# Patient Record
Sex: Female | Born: 1960 | Race: White | Hispanic: No | Marital: Single | Smoking: Never smoker
Health system: Southern US, Community
[De-identification: ages and names within clinical notes are randomized; demographics above are authoritative.]

## PROBLEM LIST (undated history)

## (undated) DIAGNOSIS — R634 Abnormal weight loss: Secondary | ICD-10-CM

## (undated) DIAGNOSIS — M109 Gout, unspecified: Secondary | ICD-10-CM

## (undated) DIAGNOSIS — K219 Gastro-esophageal reflux disease without esophagitis: Secondary | ICD-10-CM

## (undated) DIAGNOSIS — G5612 Other lesions of median nerve, left upper limb: Secondary | ICD-10-CM

## (undated) DIAGNOSIS — Z9884 Bariatric surgery status: Secondary | ICD-10-CM

## (undated) DIAGNOSIS — E7800 Pure hypercholesterolemia, unspecified: Secondary | ICD-10-CM

## (undated) DIAGNOSIS — G5620 Lesion of ulnar nerve, unspecified upper limb: Secondary | ICD-10-CM

## (undated) DIAGNOSIS — F419 Anxiety disorder, unspecified: Secondary | ICD-10-CM

## (undated) DIAGNOSIS — D649 Anemia, unspecified: Secondary | ICD-10-CM

## (undated) DIAGNOSIS — S2232XA Fracture of one rib, left side, initial encounter for closed fracture: Secondary | ICD-10-CM

## (undated) DIAGNOSIS — S63012A Subluxation of distal radioulnar joint of left wrist, initial encounter: Secondary | ICD-10-CM

## (undated) DIAGNOSIS — S52372A Galeazzi's fracture of left radius, initial encounter for closed fracture: Secondary | ICD-10-CM

## (undated) DIAGNOSIS — I10 Essential (primary) hypertension: Secondary | ICD-10-CM

## (undated) HISTORY — PX: TONSILLECTOMY: SUR1361

## (undated) HISTORY — DX: Abnormal weight loss: R63.4

## (undated) HISTORY — DX: Essential (primary) hypertension: I10

## (undated) HISTORY — PX: LAPAROSCOPIC GASTRIC BANDING: SHX1100

## (undated) HISTORY — PX: AUGMENTATION MAMMAPLASTY: SUR837

---

## 2000-04-28 ENCOUNTER — Encounter: Admission: RE | Admit: 2000-04-28 | Discharge: 2000-07-27 | Payer: Self-pay | Admitting: Internal Medicine

## 2001-01-12 ENCOUNTER — Other Ambulatory Visit: Admission: RE | Admit: 2001-01-12 | Discharge: 2001-01-12 | Payer: Self-pay | Admitting: *Deleted

## 2005-07-14 ENCOUNTER — Encounter: Admission: RE | Admit: 2005-07-14 | Discharge: 2005-10-12 | Payer: Self-pay | Admitting: Chiropractic Medicine

## 2007-09-14 ENCOUNTER — Encounter: Admission: RE | Admit: 2007-09-14 | Discharge: 2007-09-14 | Payer: Self-pay | Admitting: General Surgery

## 2007-09-19 ENCOUNTER — Ambulatory Visit (HOSPITAL_COMMUNITY): Admission: RE | Admit: 2007-09-19 | Discharge: 2007-09-19 | Payer: Self-pay | Admitting: General Surgery

## 2008-02-16 ENCOUNTER — Encounter: Admission: RE | Admit: 2008-02-16 | Discharge: 2008-03-19 | Payer: Self-pay | Admitting: General Surgery

## 2008-03-06 ENCOUNTER — Ambulatory Visit (HOSPITAL_COMMUNITY): Admission: RE | Admit: 2008-03-06 | Discharge: 2008-03-07 | Payer: Self-pay | Admitting: General Surgery

## 2008-03-09 HISTORY — PX: LAPAROSCOPIC GASTRIC BANDING: SHX1100

## 2008-03-20 ENCOUNTER — Encounter: Admission: RE | Admit: 2008-03-20 | Discharge: 2008-06-18 | Payer: Self-pay | Admitting: General Surgery

## 2008-06-14 ENCOUNTER — Encounter: Admission: RE | Admit: 2008-06-14 | Discharge: 2008-06-14 | Payer: Self-pay | Admitting: General Surgery

## 2008-09-13 ENCOUNTER — Encounter: Admission: RE | Admit: 2008-09-13 | Discharge: 2008-09-13 | Payer: Self-pay | Admitting: General Surgery

## 2009-07-23 ENCOUNTER — Encounter: Admission: RE | Admit: 2009-07-23 | Discharge: 2009-07-23 | Payer: Self-pay | Admitting: Family Medicine

## 2009-07-30 ENCOUNTER — Ambulatory Visit (HOSPITAL_BASED_OUTPATIENT_CLINIC_OR_DEPARTMENT_OTHER): Admission: RE | Admit: 2009-07-30 | Discharge: 2009-07-30 | Payer: Self-pay | Admitting: Urology

## 2010-01-27 HISTORY — PX: AUGMENTATION MAMMAPLASTY: SUR837

## 2010-07-10 ENCOUNTER — Encounter: Payer: Self-pay | Admitting: Oncology

## 2010-08-08 ENCOUNTER — Encounter (HOSPITAL_BASED_OUTPATIENT_CLINIC_OR_DEPARTMENT_OTHER): Payer: BC Managed Care – PPO | Admitting: Oncology

## 2010-08-08 ENCOUNTER — Other Ambulatory Visit: Payer: Self-pay | Admitting: Oncology

## 2010-08-08 DIAGNOSIS — D649 Anemia, unspecified: Secondary | ICD-10-CM

## 2010-08-08 LAB — FERRITIN: Ferritin: 8 ng/mL — ABNORMAL LOW (ref 10–291)

## 2010-08-08 LAB — CBC WITH DIFFERENTIAL/PLATELET
BASO%: 0.5 % (ref 0.0–2.0)
EOS%: 0.8 % (ref 0.0–7.0)
Eosinophils Absolute: 0.1 10*3/uL (ref 0.0–0.5)
HCT: 33.6 % — ABNORMAL LOW (ref 34.8–46.6)
MONO%: 9.3 % (ref 0.0–14.0)
NEUT%: 71.1 % (ref 38.4–76.8)
Platelets: 253 10*3/uL (ref 145–400)

## 2010-08-11 LAB — POCT HEMOGLOBIN-HEMACUE: Hemoglobin: 10.5 g/dL — ABNORMAL LOW (ref 12.0–15.0)

## 2010-08-14 ENCOUNTER — Other Ambulatory Visit: Payer: Self-pay | Admitting: Oncology

## 2010-08-14 LAB — FECAL OCCULT BLOOD, GUAIAC: Occult Blood: NEGATIVE

## 2010-09-04 ENCOUNTER — Other Ambulatory Visit: Payer: Self-pay | Admitting: Oncology

## 2010-09-04 ENCOUNTER — Encounter (HOSPITAL_BASED_OUTPATIENT_CLINIC_OR_DEPARTMENT_OTHER): Payer: BC Managed Care – PPO | Admitting: Oncology

## 2010-09-04 DIAGNOSIS — D509 Iron deficiency anemia, unspecified: Secondary | ICD-10-CM

## 2010-09-04 DIAGNOSIS — I10 Essential (primary) hypertension: Secondary | ICD-10-CM

## 2010-09-04 DIAGNOSIS — E119 Type 2 diabetes mellitus without complications: Secondary | ICD-10-CM

## 2010-09-04 DIAGNOSIS — D649 Anemia, unspecified: Secondary | ICD-10-CM

## 2010-09-04 LAB — CBC WITH DIFFERENTIAL/PLATELET
BASO%: 0.4 % (ref 0.0–2.0)
Basophils Absolute: 0 10*3/uL (ref 0.0–0.1)
EOS%: 0.3 % (ref 0.0–7.0)
HCT: 31.7 % — ABNORMAL LOW (ref 34.8–46.6)
LYMPH%: 15.9 % (ref 14.0–49.7)
MONO#: 0.6 10*3/uL (ref 0.1–0.9)
NEUT%: 77.6 % — ABNORMAL HIGH (ref 38.4–76.8)
RBC: 4.79 10*6/uL (ref 3.70–5.45)
RDW: 18 % — ABNORMAL HIGH (ref 11.2–14.5)
lymph#: 1.6 10*3/uL (ref 0.9–3.3)

## 2010-09-19 ENCOUNTER — Encounter (HOSPITAL_BASED_OUTPATIENT_CLINIC_OR_DEPARTMENT_OTHER): Payer: BC Managed Care – PPO | Admitting: Oncology

## 2010-09-19 DIAGNOSIS — D509 Iron deficiency anemia, unspecified: Secondary | ICD-10-CM

## 2010-09-30 NOTE — Op Note (Signed)
Lori Summers, Lori Summers                 ACCOUNT NO.:  [id number]   MEDICAL RECORD NO.:  [id number]          PATIENT TYPE:  OIB   LOCATION:  1539                         FACILITY:  [facility]   PHYSICIAN:  Sharlet Salina T. Hoxworth, M.D.DATE OF BIRTH:  Sep 19, 1960   DATE OF PROCEDURE:  03/06/2008  DATE OF DISCHARGE:                               OPERATIVE REPORT   PREOPERATIVE DIAGNOSIS:  Morbid obesity.   POSTOPERATIVE DIAGNOSIS:  Morbid obesity.   SURGICAL PROCEDURES:  Placement of laparoscopic adjustable gastric band.   SURGEON:  Lorne Skeens. Hoxworth, M.D.   ASSISTANT:  Alfonse Ras, MD   ANESTHESIA:  General.   BRIEF HISTORY:  Lori Summers is a 50 year old female with progressive  morbid obesity unresponsive to medical management and comorbidities of  diabetes, hypertension, elevated cholesterol and arthritis.  She  presents with a BMI of 38.4.  After extensive discussion of options and  risks detailed elsewhere we elected to proceed with placement of  laparoscopic adjustable gastric band.  She is brought to the operating  room for this procedure.   DESCRIPTION OF OPERATION:  The patient was brought to the operating room  and placed in supine position on the operating table and general  orotracheal anesthesia was induced.  She had received preoperative IV  antibiotics and subcutaneous heparin.  PAS were placed.  The abdomen was  widely sterilely prepped and draped.  Correct patient and procedure were  verified.  An access was obtained with an 11 mm Optiview trocar in the  left subcostal space without difficulty and pneumoperitoneum  established.  Under direct vision a 15 mm trocar was placed well  laterally in the right upper quadrant and an 11 mm trocar in the right  upper quadrant subcostal space and another 11 mm trocar for the camera  port just to the left of midline in the upper abdomen.  Through a 5 mm  site in the epigastrium the [facility] retractor was placed and left lobe  of  the liver elevated with excellent exposure of the hiatus and upper  stomach.  I did not see any evidence of a hiatal hernia grossly.  A  small hiatal hernia had been suggested on her upper GI series.  The  sizing tube was passed into the stomach and the balloon inflated 15 mL  and pulled back snugly against the hiatus.  There did not appear to be  any significant hiatal hernia.  The sizing tube was withdrawn.  Peritoneum at the angle of His was incised and blunt dissection carried  down along the left crus toward the retrogastric space.  The pars  flaccida was divided in an avascular area and the base of the right crus  identified.  The perineum was incised and using the finger dissector  blunt dissection was carried easily retrogastrically and the finger  deployed up to the previously dissected area of angle of His without  difficulty.  A flushed AB standard band system was introduced in the  abdomen, the tubing placed in the finger dissector and then brought back  behind the stomach and the band  brought back behind the stomach without  difficulty.  With the sizing tube placed back in the stomach the band  was snapped into place without any undue tension.  Sizing tube was  removed.  Holding the tubing toward the patient's feet the fundus was  imbricated up over the band to the small gastric pouch with three  interrupted 2-0 Ethibond sutures.  In addition, an anterior gastric  gastric suture was placed just beneath the band.  Following this the  tubing was brought out through the right mid abdominal site.  All CO2  evacuated and the trocars removed.  This right mid abdominal incision  was lengthened slightly and the anterior fascia exposed and four 2-0  Prolene sutures  placed.  These were used to secure the port to the fascia after cutting  and attaching the tubing.  Incisions were then closed with subcutaneous  Vicryl and subcuticular 4-0 Monocryl and Dermabond.  Sponge, needle and   instrument counts were correct.  The patient was taken to recovery in  good condition.      Lorne Skeens. Hoxworth, M.D.  Electronically Signed     BTH/MEDQ  D:  03/06/2008  T:  03/07/2008  Job:  [id number]

## 2010-10-09 ENCOUNTER — Other Ambulatory Visit: Payer: Self-pay | Admitting: Oncology

## 2010-10-09 ENCOUNTER — Encounter (HOSPITAL_BASED_OUTPATIENT_CLINIC_OR_DEPARTMENT_OTHER): Payer: BC Managed Care – PPO | Admitting: Oncology

## 2010-10-09 ENCOUNTER — Encounter: Payer: Self-pay | Admitting: Internal Medicine

## 2010-10-09 DIAGNOSIS — D649 Anemia, unspecified: Secondary | ICD-10-CM

## 2010-10-09 DIAGNOSIS — D509 Iron deficiency anemia, unspecified: Secondary | ICD-10-CM

## 2010-10-09 LAB — CBC WITH DIFFERENTIAL/PLATELET
EOS%: 1.7 % (ref 0.0–7.0)
HGB: 10.9 g/dL — ABNORMAL LOW (ref 11.6–15.9)
MCV: 72.5 fL — ABNORMAL LOW (ref 79.5–101.0)
MONO#: 0.7 10*3/uL (ref 0.1–0.9)
NEUT%: 60.3 % (ref 38.4–76.8)
Platelets: 245 10*3/uL (ref 145–400)
lymph#: 1.8 10*3/uL (ref 0.9–3.3)

## 2010-10-30 ENCOUNTER — Other Ambulatory Visit: Payer: Self-pay | Admitting: Oncology

## 2010-10-30 ENCOUNTER — Encounter (HOSPITAL_BASED_OUTPATIENT_CLINIC_OR_DEPARTMENT_OTHER): Payer: BC Managed Care – PPO | Admitting: Oncology

## 2010-10-30 DIAGNOSIS — D649 Anemia, unspecified: Secondary | ICD-10-CM

## 2010-10-30 DIAGNOSIS — I10 Essential (primary) hypertension: Secondary | ICD-10-CM

## 2010-10-30 DIAGNOSIS — E119 Type 2 diabetes mellitus without complications: Secondary | ICD-10-CM

## 2010-10-30 DIAGNOSIS — D509 Iron deficiency anemia, unspecified: Secondary | ICD-10-CM

## 2010-10-30 LAB — CBC WITH DIFFERENTIAL/PLATELET
BASO%: 0.5 % (ref 0.0–2.0)
EOS%: 1 % (ref 0.0–7.0)
HCT: 35.4 % (ref 34.8–46.6)
LYMPH%: 29.4 % (ref 14.0–49.7)
MONO#: 0.7 10*3/uL (ref 0.1–0.9)
MONO%: 11.2 % (ref 0.0–14.0)
Platelets: ADEQUATE 10*3/uL (ref 145–400)
RBC: 4.72 10*6/uL (ref 3.70–5.45)
WBC: 6 10*3/uL (ref 3.9–10.3)
lymph#: 1.8 10*3/uL (ref 0.9–3.3)
nRBC: 0 % (ref 0–0)

## 2010-11-28 ENCOUNTER — Encounter (HOSPITAL_BASED_OUTPATIENT_CLINIC_OR_DEPARTMENT_OTHER): Payer: BC Managed Care – PPO | Admitting: Oncology

## 2010-11-28 ENCOUNTER — Other Ambulatory Visit: Payer: Self-pay | Admitting: Oncology

## 2010-11-28 DIAGNOSIS — I10 Essential (primary) hypertension: Secondary | ICD-10-CM

## 2010-11-28 DIAGNOSIS — D509 Iron deficiency anemia, unspecified: Secondary | ICD-10-CM

## 2010-11-28 DIAGNOSIS — E119 Type 2 diabetes mellitus without complications: Secondary | ICD-10-CM

## 2010-11-28 DIAGNOSIS — D649 Anemia, unspecified: Secondary | ICD-10-CM

## 2010-11-28 LAB — CBC WITH DIFFERENTIAL/PLATELET
HCT: 36.8 % (ref 34.8–46.6)
HGB: 12.9 g/dL (ref 11.6–15.9)
LYMPH%: 24.3 % (ref 14.0–49.7)
MCH: 28.9 pg (ref 25.1–34.0)
MCV: 82.6 fL (ref 79.5–101.0)
MONO#: 0.3 10*3/uL (ref 0.1–0.9)
MONO%: 6.8 % (ref 0.0–14.0)
NEUT%: 66.9 % (ref 38.4–76.8)

## 2010-12-08 ENCOUNTER — Other Ambulatory Visit: Payer: BC Managed Care – PPO | Admitting: Internal Medicine

## 2011-02-10 ENCOUNTER — Encounter (HOSPITAL_BASED_OUTPATIENT_CLINIC_OR_DEPARTMENT_OTHER): Payer: BC Managed Care – PPO | Admitting: Oncology

## 2011-02-10 ENCOUNTER — Other Ambulatory Visit: Payer: Self-pay | Admitting: Oncology

## 2011-02-10 DIAGNOSIS — E119 Type 2 diabetes mellitus without complications: Secondary | ICD-10-CM

## 2011-02-10 DIAGNOSIS — R5381 Other malaise: Secondary | ICD-10-CM

## 2011-02-10 DIAGNOSIS — R5383 Other fatigue: Secondary | ICD-10-CM

## 2011-02-10 DIAGNOSIS — D509 Iron deficiency anemia, unspecified: Secondary | ICD-10-CM

## 2011-02-10 DIAGNOSIS — I10 Essential (primary) hypertension: Secondary | ICD-10-CM

## 2011-02-10 LAB — CBC WITH DIFFERENTIAL/PLATELET
EOS%: 1.2 % (ref 0.0–7.0)
Eosinophils Absolute: 0.1 10*3/uL (ref 0.0–0.5)
HCT: 38.2 % (ref 34.8–46.6)
LYMPH%: 21.9 % (ref 14.0–49.7)
MCHC: 35 g/dL (ref 31.5–36.0)
MCV: 87.6 fL (ref 79.5–101.0)
MONO#: 0.5 10*3/uL (ref 0.1–0.9)
MONO%: 8.5 % (ref 0.0–14.0)
Platelets: 241 10*3/uL (ref 145–400)
RBC: 4.36 10*6/uL (ref 3.70–5.45)
RDW: 13.5 % (ref 11.2–14.5)
WBC: 5.7 10*3/uL (ref 3.9–10.3)

## 2011-02-16 ENCOUNTER — Encounter (INDEPENDENT_AMBULATORY_CARE_PROVIDER_SITE_OTHER): Payer: Self-pay | Admitting: General Surgery

## 2011-02-16 ENCOUNTER — Ambulatory Visit (INDEPENDENT_AMBULATORY_CARE_PROVIDER_SITE_OTHER): Payer: BC Managed Care – PPO | Admitting: General Surgery

## 2011-02-16 DIAGNOSIS — R634 Abnormal weight loss: Secondary | ICD-10-CM

## 2011-02-16 DIAGNOSIS — Z9884 Bariatric surgery status: Secondary | ICD-10-CM

## 2011-02-16 NOTE — Progress Notes (Signed)
Patient returns with history of lap band placed October 2009. We last did a small fill in January. She states she did okay for several months but has had 2 months of nighttime regurgitation and some persistent nausea. For about the past month she has had frequent vomiting that has been somewhat inconsistent. She has had several surgeries this year for breast reconstruction and initially attributed her symptoms to side effects from this surgery.  On examination her weight is down 32 pounds since January 2 111 and her BMI is down from 23.9-18.8. On exam she does not appear acutely ill but is obviously underweight.  I removed 2 cc today to bring her down to 3.25 cc. We will check a KUB for band placement. We will touch base by phone in 3 days to make sure she is tolerating solid food and that her nighttime regurgitation has resolved. If it has I. will see her back in one month and if it does not we will get her right back in.

## 2011-02-16 NOTE — Patient Instructions (Signed)
Cautiously resuming her solid food. We will follow up by phone in 3 days to make sure your symptoms are improved. Otherwise return in one month.

## 2011-02-17 ENCOUNTER — Ambulatory Visit
Admission: RE | Admit: 2011-02-17 | Discharge: 2011-02-17 | Disposition: A | Discharge status: Home / Self Care | Payer: BC Managed Care – PPO | Source: Ambulatory Visit | Attending: General Surgery | Admitting: General Surgery

## 2011-02-17 DIAGNOSIS — Z9884 Bariatric surgery status: Secondary | ICD-10-CM

## 2011-02-17 DIAGNOSIS — R634 Abnormal weight loss: Secondary | ICD-10-CM

## 2011-02-17 LAB — DIFFERENTIAL
Eosinophils Absolute: 0
Eosinophils Relative: 0
Lymphs Abs: 0.9
Monocytes Absolute: 1
Monocytes Relative: 9

## 2011-02-17 LAB — GLUCOSE, CAPILLARY
Glucose-Capillary: 132 — ABNORMAL HIGH
Glucose-Capillary: 135 — ABNORMAL HIGH
Glucose-Capillary: 148 — ABNORMAL HIGH
Glucose-Capillary: 152 — ABNORMAL HIGH
Glucose-Capillary: 172 — ABNORMAL HIGH

## 2011-02-17 LAB — BASIC METABOLIC PANEL
BUN: 7
Calcium: 9.7
Chloride: 101
Sodium: 138

## 2011-02-17 LAB — HEMOGLOBIN AND HEMATOCRIT, BLOOD
HCT: 34.3 — ABNORMAL LOW
Hemoglobin: 10.9 — ABNORMAL LOW

## 2011-02-17 LAB — CBC
MCHC: 32.8
RDW: 17.9 — ABNORMAL HIGH

## 2011-02-19 ENCOUNTER — Telehealth (INDEPENDENT_AMBULATORY_CARE_PROVIDER_SITE_OTHER): Payer: Self-pay

## 2011-02-19 NOTE — Telephone Encounter (Signed)
Patient called and wanted to report that after her band adjustment, she is doing well.  Everything is going down.  She would like her xray results from the other day.

## 2011-03-20 ENCOUNTER — Ambulatory Visit (INDEPENDENT_AMBULATORY_CARE_PROVIDER_SITE_OTHER): Payer: BC Managed Care – PPO | Admitting: General Surgery

## 2011-03-20 ENCOUNTER — Encounter (INDEPENDENT_AMBULATORY_CARE_PROVIDER_SITE_OTHER): Payer: Self-pay | Admitting: General Surgery

## 2011-03-20 NOTE — Progress Notes (Signed)
History: The patient returns for followup of a pea standard lap band placed March 06, 2008. She was last seen one month ago when she developed over restriction and had lost down to a BMI of 18.8. We removed 2 cc at that time to bring her down to 3.25 cc. She now feels that she has no restriction and is worried about weight regain.  On exam today her weight is 132 with a BMI of 22.7 and she appears very well.  With this information I added 0.5 cc today to bring her up to 3.75. She will return in one month.

## 2011-04-24 ENCOUNTER — Encounter (INDEPENDENT_AMBULATORY_CARE_PROVIDER_SITE_OTHER): Payer: Self-pay | Admitting: General Surgery

## 2011-04-24 ENCOUNTER — Ambulatory Visit (INDEPENDENT_AMBULATORY_CARE_PROVIDER_SITE_OTHER): Payer: BC Managed Care – PPO | Admitting: General Surgery

## 2011-04-24 NOTE — Progress Notes (Signed)
Patient returns for followup of LAP-BAND placed March 06, 2008. We have been gradually refilling after she developed severe over restriction in October. Since her last visit in 5 weeks ago she has gained 4 pounds up to 136 and BMI of 22.7. She does feel hungry all the time and is eating well more than a cup of food at a time. Would like to stabilize her weight at about its current. We therefore elected to go ahead with a fill.  I had a 0.5 cc which should bring her up to 4.25 cc. She was able to tolerate water. She will return in 6 weeks.

## 2011-05-05 ENCOUNTER — Other Ambulatory Visit: Payer: BC Managed Care – PPO | Admitting: Lab

## 2011-06-03 ENCOUNTER — Encounter (INDEPENDENT_AMBULATORY_CARE_PROVIDER_SITE_OTHER): Payer: BC Managed Care – PPO | Admitting: General Surgery

## 2011-07-17 ENCOUNTER — Encounter (INDEPENDENT_AMBULATORY_CARE_PROVIDER_SITE_OTHER): Payer: BC Managed Care – PPO | Admitting: General Surgery

## 2011-08-07 ENCOUNTER — Encounter (INDEPENDENT_AMBULATORY_CARE_PROVIDER_SITE_OTHER): Payer: BC Managed Care – PPO | Admitting: General Surgery

## 2011-08-21 ENCOUNTER — Telehealth: Payer: Self-pay | Admitting: Oncology

## 2011-08-21 ENCOUNTER — Other Ambulatory Visit: Payer: Self-pay | Admitting: *Deleted

## 2011-08-21 DIAGNOSIS — D649 Anemia, unspecified: Secondary | ICD-10-CM

## 2011-08-21 NOTE — Telephone Encounter (Signed)
pts called lmovm to cancelled appt for 04/08 and she stated that she will rtn call to r/s

## 2011-08-24 ENCOUNTER — Ambulatory Visit: Payer: BC Managed Care – PPO | Admitting: Oncology

## 2011-08-24 ENCOUNTER — Other Ambulatory Visit: Payer: BC Managed Care – PPO | Admitting: Lab

## 2011-10-16 ENCOUNTER — Encounter (INDEPENDENT_AMBULATORY_CARE_PROVIDER_SITE_OTHER): Payer: BC Managed Care – PPO | Admitting: General Surgery

## 2011-12-04 ENCOUNTER — Encounter (INDEPENDENT_AMBULATORY_CARE_PROVIDER_SITE_OTHER): Payer: BC Managed Care – PPO | Admitting: General Surgery

## 2012-01-01 ENCOUNTER — Encounter (INDEPENDENT_AMBULATORY_CARE_PROVIDER_SITE_OTHER): Payer: BC Managed Care – PPO | Admitting: General Surgery

## 2012-03-03 ENCOUNTER — Other Ambulatory Visit (INDEPENDENT_AMBULATORY_CARE_PROVIDER_SITE_OTHER): Payer: Self-pay | Admitting: Surgery

## 2012-03-03 ENCOUNTER — Ambulatory Visit
Admission: RE | Admit: 2012-03-03 | Discharge: 2012-03-03 | Disposition: A | Discharge status: Home / Self Care | Payer: BC Managed Care – PPO | Source: Ambulatory Visit | Attending: Physician Assistant | Admitting: Physician Assistant

## 2012-03-03 ENCOUNTER — Encounter (INDEPENDENT_AMBULATORY_CARE_PROVIDER_SITE_OTHER): Payer: Self-pay

## 2012-03-03 ENCOUNTER — Ambulatory Visit (INDEPENDENT_AMBULATORY_CARE_PROVIDER_SITE_OTHER): Payer: BC Managed Care – PPO | Admitting: Physician Assistant

## 2012-03-03 VITALS — BP 106/90 | HR 105 | Ht 65.0 in | Wt 109.8 lb

## 2012-03-03 DIAGNOSIS — Z4651 Encounter for fitting and adjustment of gastric lap band: Secondary | ICD-10-CM

## 2012-03-03 DIAGNOSIS — R1110 Vomiting, unspecified: Secondary | ICD-10-CM

## 2012-03-03 NOTE — Patient Instructions (Signed)
Attend x-ray today. Return in 2 weeks or sooner if needed.

## 2012-03-03 NOTE — Progress Notes (Addendum)
  HISTORY: SERITA DEGROOTE is a 51 y.o.female who received an AP-Standard lap-band in October 2009 by Dr. Johna Sheriff. She comes in with three weeks of progressive dysphagia to the point that she's been unable to tolerate liquids for the past 3 days. She has lost 27 lbs since her last visit one year ago, with 23 lbs of that being in the past two weeks she says. She thought she had a stomach virus earlier in the month which triggered this.  VITAL SIGNS: Filed Vitals:   03/03/12 1410  BP: 106/90  Pulse: 105    PHYSICAL EXAM: Physical exam reveals a very well-appearing 51 y.o.female in no apparent distress Neurologic: Awake, alert, oriented Psych: Bright affect, conversant Respiratory: Breathing even and unlabored. No stridor or wheezing Abdomen: Soft, nontender, nondistended to palpation. Incisions well-healed. No incisional hernias. Port easily palpated. Extremities: Atraumatic, good range of motion.  ASSESMENT: 51 y.o.  female  s/p AP-Standard lap-band.   PLAN: The patient's port was accessed with a 20G Huber needle without difficulty. Clear fluid was aspirated and 5 mL was removed from the band to render it empty. She had no difficulty whatsoever drinking water. I ordered a KUB to rule out slip. If this looks ok, we'll have her return in two weeks.  Ms. Bunner KUB showed a significant slip.  She had her AP Standard lap band placed 03/06/2008.  Her pre-op BMI 38.9.  Her weight is now 105 pounds and her BMI is 18.3.  So she is very thin.  As per Mariya Mottley's note, she has been vomiting on and off for about 3 weeks to the point when she came in today, she had trouble with even water.  But since the fluid has been removed, she has been able to drink 1 and 1/2 cups of water.  Vital Signs: BP 106/90  Pulse 105  Ht 5\' 5"  (1.651 m)  Wt 109 lb 12.8 oz (49.805 kg)  BMI 18.27 kg/m2  SpO2 97%  Abdomen:  Soft.  Port in RUQ. No mass or tenderness.  Assessment and Plan:  I discussed with about the  slipped lap band.   I think she will need revisional surgery.  I discussed with her the resiting/fixing the slipped lap band.  I discussed the risks, which include, but are not limited to, bleeding, infection, bowel perforation, and removal of the lap band.  I spoke with Dr. Johna Sheriff.  If she continues to vomit, she is to call us back, in that she may need hospitalization.  We will get a KUB on Monday, 10/21.  If the lap band is still slipped, she is set up for Dr. Johna Sheriff to fix the lap band slip on Tuesday 10/22.  I switched coverage around so I will be the first assistant.  The patient understands the plan and the probable need for lap band revision.  Ovidio Kin, MD, [facility] Pager: [phone] Office phone:  [phone]

## 2012-03-04 ENCOUNTER — Encounter (HOSPITAL_COMMUNITY): Payer: Self-pay | Admitting: Pharmacy Technician

## 2012-03-04 ENCOUNTER — Encounter (HOSPITAL_COMMUNITY): Payer: Self-pay | Admitting: *Deleted

## 2012-03-04 NOTE — Progress Notes (Signed)
[facility] SURGERY INSTRUCTIONS DISCUSSED WITH PT BY PHONE-INCLUDING BETASEPT SHOWER / PRECAUTIONS.

## 2012-03-07 ENCOUNTER — Ambulatory Visit
Admission: RE | Admit: 2012-03-07 | Discharge: 2012-03-07 | Disposition: A | Discharge status: Home / Self Care | Payer: BC Managed Care – PPO | Source: Ambulatory Visit | Attending: Surgery | Admitting: Surgery

## 2012-03-07 DIAGNOSIS — R1110 Vomiting, unspecified: Secondary | ICD-10-CM

## 2012-03-08 ENCOUNTER — Ambulatory Visit (HOSPITAL_COMMUNITY): Payer: BC Managed Care – PPO

## 2012-03-08 ENCOUNTER — Ambulatory Visit (HOSPITAL_COMMUNITY): Payer: BC Managed Care – PPO | Admitting: Anesthesiology

## 2012-03-08 ENCOUNTER — Ambulatory Visit (HOSPITAL_COMMUNITY)
Admission: RE | Admit: 2012-03-08 | Discharge: 2012-03-09 | Disposition: A | Discharge status: Home / Self Care | Payer: BC Managed Care – PPO | Source: Ambulatory Visit | Attending: General Surgery | Admitting: General Surgery

## 2012-03-08 ENCOUNTER — Encounter (HOSPITAL_COMMUNITY): Payer: Self-pay

## 2012-03-08 ENCOUNTER — Encounter (HOSPITAL_COMMUNITY): Payer: Self-pay | Admitting: Anesthesiology

## 2012-03-08 ENCOUNTER — Encounter (HOSPITAL_COMMUNITY)
Admission: RE | Disposition: A | Discharge status: Home / Self Care | Payer: Self-pay | Source: Ambulatory Visit | Attending: General Surgery

## 2012-03-08 DIAGNOSIS — R112 Nausea with vomiting, unspecified: Secondary | ICD-10-CM

## 2012-03-08 DIAGNOSIS — T85898A Other specified complication of other internal prosthetic devices, implants and grafts, initial encounter: Secondary | ICD-10-CM

## 2012-03-08 DIAGNOSIS — E119 Type 2 diabetes mellitus without complications: Secondary | ICD-10-CM | POA: Insufficient documentation

## 2012-03-08 DIAGNOSIS — Z4651 Encounter for fitting and adjustment of gastric lap band: Secondary | ICD-10-CM | POA: Insufficient documentation

## 2012-03-08 DIAGNOSIS — I10 Essential (primary) hypertension: Secondary | ICD-10-CM | POA: Insufficient documentation

## 2012-03-08 DIAGNOSIS — Z9884 Bariatric surgery status: Secondary | ICD-10-CM | POA: Insufficient documentation

## 2012-03-08 DIAGNOSIS — Z79899 Other long term (current) drug therapy: Secondary | ICD-10-CM | POA: Insufficient documentation

## 2012-03-08 DIAGNOSIS — K9509 Other complications of gastric band procedure: Secondary | ICD-10-CM

## 2012-03-08 HISTORY — PX: GASTRIC BANDING PORT REVISION: SHX5246

## 2012-03-08 LAB — CBC
Hemoglobin: 11.9 g/dL — ABNORMAL LOW (ref 12.0–15.0)
MCH: 27 pg (ref 26.0–34.0)
MCV: 80.2 fL (ref 78.0–100.0)
Platelets: 245 10*3/uL (ref 150–400)
RBC: 4.4 MIL/uL (ref 3.87–5.11)

## 2012-03-08 LAB — BASIC METABOLIC PANEL
BUN: 10 mg/dL (ref 6–23)
CO2: 32 mEq/L (ref 19–32)
Calcium: 9.4 mg/dL (ref 8.4–10.5)
Creatinine, Ser: 0.61 mg/dL (ref 0.50–1.10)
Glucose, Bld: 105 mg/dL — ABNORMAL HIGH (ref 70–99)

## 2012-03-08 LAB — GLUCOSE, CAPILLARY: Glucose-Capillary: 106 mg/dL — ABNORMAL HIGH (ref 70–99)

## 2012-03-08 SURGERY — REVISION, GASTRIC BAND, LAPAROSCOPIC
Anesthesia: General | Wound class: Clean

## 2012-03-08 MED ORDER — LACTATED RINGERS IV SOLN
INTRAVENOUS | Status: DC | PRN
  Administered 2012-03-08 (×3): via INTRAVENOUS

## 2012-03-08 MED ORDER — DEXAMETHASONE SODIUM PHOSPHATE 10 MG/ML IJ SOLN
INTRAMUSCULAR | Status: DC | PRN
  Administered 2012-03-08 (×2): 5 mg via INTRAVENOUS
  Administered 2012-03-08 (×2): 10 mg via INTRAVENOUS

## 2012-03-08 MED ORDER — LIDOCAINE HCL (CARDIAC) 20 MG/ML IV SOLN
INTRAVENOUS | Status: DC | PRN
  Administered 2012-03-08: 50 mg via INTRAVENOUS

## 2012-03-08 MED ORDER — BUPIVACAINE-EPINEPHRINE (PF) 0.5% -1:200000 IJ SOLN
INTRAMUSCULAR | Status: AC
  Filled 2012-03-08: qty 10

## 2012-03-08 MED ORDER — ACETAMINOPHEN 160 MG/5ML PO SOLN: 650.0000 mg | ORAL | Status: DC | PRN

## 2012-03-08 MED ORDER — HYDROMORPHONE HCL PF 1 MG/ML IJ SOLN
0.2500 mg | INTRAMUSCULAR | Status: DC | PRN
  Administered 2012-03-08: 0.5 mg via INTRAVENOUS
  Administered 2012-03-08 (×2): 0.25 mg via INTRAVENOUS

## 2012-03-08 MED ORDER — INFLUENZA VIRUS VACC SPLIT PF IM SUSP
0.5000 mL | INTRAMUSCULAR | Status: AC
  Administered 2012-03-09: 0.5 mL via INTRAMUSCULAR
  Filled 2012-03-08: qty 0.5

## 2012-03-08 MED ORDER — ACETAMINOPHEN 10 MG/ML IV SOLN
INTRAVENOUS | Status: AC
  Filled 2012-03-08: qty 100

## 2012-03-08 MED ORDER — UNJURY VANILLA POWDER: 2.0000 [oz_av] | Freq: Four times a day (QID) | ORAL | Status: DC

## 2012-03-08 MED ORDER — MIDAZOLAM HCL 5 MG/5ML IJ SOLN
INTRAMUSCULAR | Status: DC | PRN
  Administered 2012-03-08: 2 mg via INTRAVENOUS

## 2012-03-08 MED ORDER — HYDROMORPHONE HCL PF 1 MG/ML IJ SOLN
INTRAMUSCULAR | Status: AC
  Filled 2012-03-08: qty 1

## 2012-03-08 MED ORDER — MORPHINE SULFATE 2 MG/ML IJ SOLN
2.0000 mg | INTRAMUSCULAR | Status: DC | PRN
  Administered 2012-03-08 (×3): 4 mg via INTRAVENOUS
  Administered 2012-03-09 (×2): 2 mg via INTRAVENOUS
  Filled 2012-03-08 (×2): qty 2
  Filled 2012-03-08 (×2): qty 1
  Filled 2012-03-08: qty 2

## 2012-03-08 MED ORDER — CHLORHEXIDINE GLUCONATE 4 % EX LIQD
1.0000 "application " | Freq: Once | CUTANEOUS | Status: DC
  Filled 2012-03-08: qty 15

## 2012-03-08 MED ORDER — SODIUM CHLORIDE 0.9 % IJ SOLN
INTRAMUSCULAR | Status: DC | PRN
  Administered 2012-03-08: 20 mL

## 2012-03-08 MED ORDER — CEFAZOLIN SODIUM-DEXTROSE 2-3 GM-% IV SOLR
2.0000 g | INTRAVENOUS | Status: AC
  Administered 2012-03-08: 2 g via INTRAVENOUS

## 2012-03-08 MED ORDER — UNJURY CHICKEN SOUP POWDER
2.0000 [oz_av] | Freq: Four times a day (QID) | ORAL | Status: DC
  Administered 2012-03-09: 2 [oz_av] via ORAL

## 2012-03-08 MED ORDER — 0.9 % SODIUM CHLORIDE (POUR BTL) OPTIME
TOPICAL | Status: DC | PRN
  Administered 2012-03-08: 1000 mL

## 2012-03-08 MED ORDER — MUPIROCIN 2 % EX OINT
TOPICAL_OINTMENT | Freq: Two times a day (BID) | CUTANEOUS | Status: DC
  Administered 2012-03-08 – 2012-03-09 (×2): via NASAL
  Filled 2012-03-08 (×2): qty 22

## 2012-03-08 MED ORDER — PROPOFOL 10 MG/ML IV BOLUS
INTRAVENOUS | Status: DC | PRN
  Administered 2012-03-08: 150 mg via INTRAVENOUS

## 2012-03-08 MED ORDER — UNJURY CHOCOLATE CLASSIC POWDER: 2.0000 [oz_av] | Freq: Four times a day (QID) | ORAL | Status: DC

## 2012-03-08 MED ORDER — SUCCINYLCHOLINE CHLORIDE 20 MG/ML IJ SOLN
INTRAMUSCULAR | Status: DC | PRN
  Administered 2012-03-08: 100 mg via INTRAVENOUS

## 2012-03-08 MED ORDER — ACETAMINOPHEN 10 MG/ML IV SOLN
INTRAVENOUS | Status: DC | PRN
  Administered 2012-03-08: 1000 mg via INTRAVENOUS

## 2012-03-08 MED ORDER — FENTANYL CITRATE 0.05 MG/ML IJ SOLN
INTRAMUSCULAR | Status: DC | PRN
  Administered 2012-03-08 (×2): 50 ug via INTRAVENOUS

## 2012-03-08 MED ORDER — POTASSIUM CHLORIDE IN NACL 20-0.9 MEQ/L-% IV SOLN
INTRAVENOUS | Status: DC
  Administered 2012-03-08 – 2012-03-09 (×2): via INTRAVENOUS
  Filled 2012-03-08 (×3): qty 1000

## 2012-03-08 MED ORDER — OXYCODONE-ACETAMINOPHEN 5-325 MG/5ML PO SOLN: 5.0000 mL | ORAL | Status: DC | PRN

## 2012-03-08 MED ORDER — HEPARIN SODIUM (PORCINE) 5000 UNIT/ML IJ SOLN
5000.0000 [IU] | Freq: Three times a day (TID) | INTRAMUSCULAR | Status: DC
  Administered 2012-03-08 – 2012-03-09 (×3): 5000 [IU] via SUBCUTANEOUS
  Filled 2012-03-08 (×6): qty 1

## 2012-03-08 MED ORDER — ONDANSETRON HCL 4 MG/2ML IJ SOLN: 4.0000 mg | INTRAMUSCULAR | Status: DC | PRN

## 2012-03-08 MED ORDER — GLYCOPYRROLATE 0.2 MG/ML IJ SOLN
INTRAMUSCULAR | Status: DC | PRN
  Administered 2012-03-08: 0.4 mg via INTRAVENOUS

## 2012-03-08 MED ORDER — BUPIVACAINE-EPINEPHRINE 0.5% -1:200000 IJ SOLN
INTRAMUSCULAR | Status: DC | PRN
  Administered 2012-03-08: 16 mL

## 2012-03-08 MED ORDER — NEOSTIGMINE METHYLSULFATE 1 MG/ML IJ SOLN
INTRAMUSCULAR | Status: DC | PRN
  Administered 2012-03-08: 3 mg via INTRAVENOUS

## 2012-03-08 MED ORDER — PROMETHAZINE HCL 25 MG/ML IJ SOLN: 6.2500 mg | INTRAMUSCULAR | Status: DC | PRN

## 2012-03-08 MED ORDER — CEFAZOLIN SODIUM-DEXTROSE 2-3 GM-% IV SOLR
INTRAVENOUS | Status: AC
  Filled 2012-03-08: qty 50

## 2012-03-08 MED ORDER — ONDANSETRON HCL 4 MG/2ML IJ SOLN
INTRAMUSCULAR | Status: DC | PRN
  Administered 2012-03-08: 4 mg via INTRAVENOUS

## 2012-03-08 MED ORDER — ROCURONIUM BROMIDE 100 MG/10ML IV SOLN
INTRAVENOUS | Status: DC | PRN
  Administered 2012-03-08: 10 mg via INTRAVENOUS

## 2012-03-08 SURGICAL SUPPLY — 59 items
ADH SKN CLS APL DERMABOND .7 (GAUZE/BANDAGES/DRESSINGS) ×1
BLADE HEX COATED 2.75 (ELECTRODE) ×2 IMPLANT
BLADE SURG 15 STRL LF DISP TIS (BLADE) IMPLANT
BLADE SURG 15 STRL SS (BLADE) ×2
BLADE SURG SZ11 CARB STEEL (BLADE) ×2 IMPLANT
CABLE HIGH FREQUENCY MONO STRZ (ELECTRODE) ×2 IMPLANT
CANISTER SUCTION 2500CC (MISCELLANEOUS) ×2 IMPLANT
CHLORAPREP W/TINT 26ML (MISCELLANEOUS) ×1 IMPLANT
CLOTH BEACON ORANGE TIMEOUT ST (SAFETY) ×2 IMPLANT
DECANTER SPIKE VIAL GLASS SM (MISCELLANEOUS) ×4 IMPLANT
DERMABOND ADVANCED (GAUZE/BANDAGES/DRESSINGS) ×1
DERMABOND ADVANCED .7 DNX12 (GAUZE/BANDAGES/DRESSINGS) ×1 IMPLANT
DEVICE SUT QUICK LOAD TK 5 (STAPLE) ×6 IMPLANT
DEVICE SUT TI-KNOT TK 5X26 (MISCELLANEOUS) ×2 IMPLANT
DEVICE SUTURE ENDOST 10MM (ENDOMECHANICALS) IMPLANT
DRAPE CAMERA CLOSED 9X96 (DRAPES) ×2 IMPLANT
ELECT REM PT RETURN 9FT ADLT (ELECTROSURGICAL) ×2
ELECTRODE REM PT RTRN 9FT ADLT (ELECTROSURGICAL) ×1 IMPLANT
GLOVE BIOGEL PI IND STRL 7.0 (GLOVE) ×1 IMPLANT
GLOVE BIOGEL PI INDICATOR 7.0 (GLOVE) ×1
GLOVE SS BIOGEL STRL SZ 7.5 (GLOVE) ×1 IMPLANT
GLOVE SUPERSENSE BIOGEL SZ 7.5 (GLOVE) ×1
GOWN STRL NON-REIN LRG LVL3 (GOWN DISPOSABLE) ×3 IMPLANT
GOWN STRL REIN XL XLG (GOWN DISPOSABLE) ×4 IMPLANT
HOVERMATT SINGLE USE (MISCELLANEOUS) ×2 IMPLANT
KIT BASIN OR (CUSTOM PROCEDURE TRAY) ×2 IMPLANT
NDL SPNL 22GX3.5 QUINCKE BK (NEEDLE) ×1 IMPLANT
NEEDLE SPNL 22GX3.5 QUINCKE BK (NEEDLE) ×2 IMPLANT
NS IRRIG 1000ML POUR BTL (IV SOLUTION) ×2 IMPLANT
PACK UNIVERSAL I (CUSTOM PROCEDURE TRAY) ×2 IMPLANT
PENCIL BUTTON HOLSTER BLD 10FT (ELECTRODE) ×2 IMPLANT
SCALPEL HARMONIC ACE (MISCELLANEOUS) ×1 IMPLANT
SET IRRIG TUBING LAPAROSCOPIC (IRRIGATION / IRRIGATOR) IMPLANT
SLEEVE ADV FIXATION 5X100MM (TROCAR) ×3 IMPLANT
SOLUTION ANTI FOG 6CC (MISCELLANEOUS) ×2 IMPLANT
SPONGE LAP 18X18 X RAY DECT (DISPOSABLE) ×1 IMPLANT
STAPLER VISISTAT 35W (STAPLE) IMPLANT
SUT ETHIBOND 2 0 SH (SUTURE) ×6
SUT ETHIBOND 2 0 SH 36X2 (SUTURE) ×3 IMPLANT
SUT MNCRL AB 4-0 PS2 18 (SUTURE) ×2 IMPLANT
SUT PROLENE 2 0 CT2 30 (SUTURE) ×1 IMPLANT
SUT SILK 0 (SUTURE)
SUT SILK 0 30XBRD TIE 6 (SUTURE) ×1 IMPLANT
SUT SURGIDAC NAB ES-9 0 48 120 (SUTURE) IMPLANT
SUT VIC AB 2-0 SH 27 (SUTURE)
SUT VIC AB 2-0 SH 27X BRD (SUTURE) ×1 IMPLANT
SYR 20CC LL (SYRINGE) ×2 IMPLANT
SYR 5ML LL (SYRINGE) ×1 IMPLANT
SYR CONTROL 10ML LL (SYRINGE) ×2 IMPLANT
SYS KII OPTICAL ACCESS 15MM (TROCAR) ×2
SYSTEM KII OPTICAL ACCESS 15MM (TROCAR) ×1 IMPLANT
TOWEL OR 17X26 10 PK STRL BLUE (TOWEL DISPOSABLE) ×2 IMPLANT
TROCAR ADV FIXATION 11X100MM (TROCAR) ×1 IMPLANT
TROCAR ADV FIXATION 5X100MM (TROCAR) ×1 IMPLANT
TROCAR BLADELESS OPT 5 100 (ENDOMECHANICALS) ×2 IMPLANT
TROCAR XCEL NON-BLD 11X100MML (ENDOMECHANICALS) IMPLANT
TROCAR Z-THREAD FIOS 5X100MM (TROCAR) ×2 IMPLANT
TUBE CALIBRATION LAPBAND (TUBING) ×2 IMPLANT
TUBING INSUFFLATION 10FT LAP (TUBING) ×2 IMPLANT

## 2012-03-08 NOTE — Anesthesia Preprocedure Evaluation (Signed)
Anesthesia Evaluation  Patient identified by MRN, date of birth, ID band Patient awake    Reviewed: Allergy & Precautions, H&P , NPO status , Patient's Chart, lab work & pertinent test results  Airway Mallampati: II TM Distance: >3 FB Neck ROM: Full    Dental No notable dental hx.    Pulmonary neg pulmonary ROS,  breath sounds clear to auscultation  Pulmonary exam normal       Cardiovascular hypertension, Pt. on medications Rhythm:Regular Rate:Normal     Neuro/Psych negative neurological ROS  negative psych ROS   GI/Hepatic negative GI ROS, Neg liver ROS,   Endo/Other  diabetes, Type 2  Renal/GU negative Renal ROS  negative genitourinary   Musculoskeletal negative musculoskeletal ROS (+)   Abdominal   Peds negative pediatric ROS (+)  Hematology negative hematology ROS (+)   Anesthesia Other Findings   Reproductive/Obstetrics negative OB ROS                           Anesthesia Physical Anesthesia Plan  ASA: II  Anesthesia Plan: General   Post-op Pain Management:    Induction: Intravenous  Airway Management Planned: Oral ETT  Additional Equipment:   Intra-op Plan:   Post-operative Plan: Extubation in OR  Informed Consent: I have reviewed the patients History and Physical, chart, labs and discussed the procedure including the risks, benefits and alternatives for the proposed anesthesia with the patient or authorized representative who has indicated his/her understanding and acceptance.   Dental advisory given  Plan Discussed with: CRNA  Anesthesia Plan Comments:         Anesthesia Quick Evaluation

## 2012-03-08 NOTE — Op Note (Signed)
Preoperative Diagnosis: SLIPPED LAP BAND  Postoprative Diagnosis: SLIPPED LAP BAND  Procedure: Procedure(s): LAPAROSCOPIC REVISION OF GASTRIC BAND   Surgeon: Glenna Fellows T   Assistants: Ovidio Kin  Anesthesia:  General endotracheal anesthesiaDiagnos  Indications:   Patient is a 51 year old female several years status post lap band placement with excellent weight loss of over 100 pounds. She however presents with several weeks of persistent nausea and vomiting and regurgitation. She has had all the fluid removed from her band in KUB shows evidence of a slip. We recommend proceeding with laparoscopic revision. We discussed the nature for the procedure, indications, risks of general anesthetic, visceral injury, damage to the band the possible need for removal. She understands and agrees to proceed.  Procedure Detail:  Patient brought to the operating room, placed in supine position on the operating table, and general endotracheal anesthesia induced. The abdomen was widely sterilely prepped and draped. The patient Timeout was performed and correct procedure verified. She received preoperative IV antibiotics. Access was obtained with a 5 mm Optiview trocar in the left upper quadrant without difficulty and pneumoperitoneum established. Under direct vision a 5 mm trocar was placed laterally in the right upper quadrant, another in the right midabdomen and another just to the left of the umbilicus with the camera port. The camera was switched to a angled 30 scope. Under direct vision a 5 mm Nathanson retractor was placed in the epigastrium and the left lobe of the liver elevated with excellent exposure of the band upper stomach. Examination showed evidence of a recent larger slip within imprints of the band and some thickening and granulation across the stomach about 5 or 6 cm below the present position of the band. There was however still a fairly large anterior gastric pouch consistent with  some degree of persistent slip. The stomach did not appear obstructed at all. Staying on the lap band adhesions around the buckle were completely taken down with sharp dissection and the harmonic scalpel and then we continued laterally to the left where it appeared that likely the previous anterior plication sutures had disrupted and we could see a area anteriorly beneath the band were likely the larger slip had been. I continued out to the patient's left and took down adhesions related to the previous plication until the stomach was completely mobilized out toward the angle of Hiss where there appeared to be one plication stitch still intact in the normal position and I left this intact. The band was then unbuckled and it was able to be positioned in a normal position creating a very small gastric pouch. The sizing tube was passed into the stomach but I was unable to buckle the band was in place which we felt was secondary to some residual edema of the gastric pouch. With the sizing tube out the band buckle easily and was able to be rotated without any apparent undue tension. I also accessed the port was able to remove an additional cc of saline. We felt that the hand was not too tight in this position. I then performed a re\re plication beginning toward the fundus and using 2-0 Ethibond sutures plicated up to the small gastric pouch. As we worked toward the lesser curve I was able to take the stomach that had previously been up above the band now reduced below it and plicated up with a small gastric pouch. The band appeared to be in excellent position and there was no evidence of bleeding or visceral injury. Following this the  Nathanson retractor was removed under direct vision all CO2 evacuated the trochars removed. Skin incisions were closed with Dermabond. Sponge needle and instrument counts were correct.  Findings: Anterior slip of lap band  Estimated Blood Loss:  Minimal         Drains: none  Blood  Given: none          Specimens: none        Complications:  * No complications entered in OR log *         Disposition: PACU - hemodynamically stable.         Condition: stable  Mariella Saa MD, FACS  03/08/2012, 9:26 AM

## 2012-03-08 NOTE — Transfer of Care (Signed)
Immediate Anesthesia Transfer of Care Note  Patient: Lori Summers  Procedure(s) Performed: Procedure(s) (LRB) with comments: LAPAROSCOPIC REVISION OF GASTRIC BAND (N/A)  Patient Location: PACU  Anesthesia Type: General  Level of Consciousness: sedated  Airway & Oxygen Therapy: Patient Spontanous Breathing and Patient connected to face mask oxygen  Post-op Assessment: Report given to PACU RN and Post -op Vital signs reviewed and stable  Post vital signs: Reviewed and stable  Complications: No apparent anesthesia complications

## 2012-03-08 NOTE — Anesthesia Postprocedure Evaluation (Signed)
  Anesthesia Post-op Note  Patient: Lori Summers  Procedure(s) Performed: Procedure(s) (LRB): LAPAROSCOPIC REVISION OF GASTRIC BAND (N/A)  Patient Location: PACU  Anesthesia Type: General  Level of Consciousness: awake and alert   Airway and Oxygen Therapy: Patient Spontanous Breathing  Post-op Pain: mild  Post-op Assessment: Post-op Vital signs reviewed, Patient's Cardiovascular Status Stable, Respiratory Function Stable, Patent Airway and No signs of Nausea or vomiting  Post-op Vital Signs: stable  Complications: No apparent anesthesia complications

## 2012-03-08 NOTE — H&P (Signed)
  Subjective:    Lori Summers is a 51 y.o. female who presents for revision of slipped lap band.  She is S/P lap band with over 100 lb wt loss.  She has several weeks of worrsening N/V and regurgitation.  Imaging shows evidence of slip that has not improved after removal of all fluid  Review of Systems Respiratory: negative Cardiovascular: negative    Objective:   BP 113/70  Pulse 71  Temp 98.4 F (36.9 C) (Oral)  Resp 18  Ht 5\' 4"  (1.626 m)  Wt 121 lb (54.885 kg)  BMI 20.77 kg/m2  SpO2 100%  LMP 03/01/2012  General:  Thin, NAD Skin:  normal and no rash or abnormalities Eyes: negative findings: conjunctivae and sclerae normal Mouth: MMM no lesions Lungs:  clear to auscultation bilaterally Heart:  regular rate and rhythm Abdomen: normal findings: soft, non-tender and port site OK  Extremities:  extremities normal, atraumatic, no cyanosis or edema Neurologic:  A&O X 3 Psychiatric:  normal mood, behavior, speech, dress, and thought processes    Assessment:  Slipped lap band    Plan: Laparoscopic revision of slipped lap band   1. Discussed the risk of surgery,  Including intestinal injury, anesthetic risks and injury to the band. The patient understands the risks, any and all questions were answered to the patient's satisfaction.

## 2012-03-09 ENCOUNTER — Ambulatory Visit (HOSPITAL_COMMUNITY): Payer: BC Managed Care – PPO

## 2012-03-09 MED ORDER — OXYCODONE-ACETAMINOPHEN 5-325 MG/5ML PO SOLN: 5.0000 mL | ORAL | Status: DC | PRN

## 2012-03-09 NOTE — Progress Notes (Signed)
Patient was given discharge teaching. I ask if any questions. Patient understood. IV was discountined. Follow up appointment intact. Wound care discussed.  Amy Thacker SN, RCC/JBM,RN-BC,BSN

## 2012-03-09 NOTE — Discharge Summary (Signed)
   Patient ID: Lori Summers [id number] 51 y.o. 1961-03-01  03/08/2012  Discharge date and time: 03/09/2012   Admitting Physician: Glenna Fellows T  Discharge Physician: Glenna Fellows T  Admission Diagnoses: SLIPPED LAP BAND  Discharge Diagnoses: Same  Operations: Procedure(s): LAPAROSCOPIC REVISION OF GASTRIC BAND  Admission Condition: good  Discharged Condition: good  Indication for Admission: patient is a 51 year old female with a history of LAP-BAND placement and excellent weight loss. She has developed persistent nausea and vomiting and imaging has shown evidence of a slipped band. We have recommended admission for laparoscopic revision.  Hospital Course: patient was admitted on the morning of her procedure. Laparoscopy she was found to have an anterior slit that had partially reduced. Her band was revised without difficulty. On the first postoperative day she is stable with normal vital signs and mild appropriate discomfort. KUB shows the band in normal position. She will start on a liquid diet today and discharged home if tolerated well.   Disposition: Home  Patient Instructions:   [place], [place]  Home Medication Instructions [id number]   Printed on:03/09/12 0805  Medication Information                    ALPRAZolam (XANAX) 0.5 MG tablet Take 0.5 mg by mouth 3 (three) times daily as needed. Anxiety           ibuprofen (ADVIL,MOTRIN) 100 MG/5ML suspension Take 250 mg by mouth every 6 (six) hours as needed. Pain           hydrochlorothiazide (MICROZIDE) 12.5 MG capsule Take 12.5 mg by mouth every morning.           Multiple Vitamins-Minerals (MULTIVITAMIN GUMMIES ADULT) CHEW Chew 1 tablet by mouth daily.           oxyCODONE-acetaminophen (ROXICET) 5-325 MG/5ML solution Take 5-10 mLs by mouth every 4 (four) hours as needed.             Activity: activity as tolerated Diet: liquid diet only Wound Care: none needed  Follow-up:  With Dr. Johna Sheriff  in 2 weeks.  Signed: Mariella Saa MD, FACS  03/09/2012, 8:05 AM

## 2012-03-09 NOTE — Progress Notes (Signed)
Pt alert and oriented; VSS; denies any nausea or vomiting;  tolerating water well; will advance to protein shakes; burping; denies flatus or BM; voiding without difficulty; ambulating in hallways without difficulty; c/o some abdominal soreness with relief from prn meds; using incentive spirometer as directed; pt already has follow up appt with [facility] in 2 weeks; discharge instructions reviewed and pt verbalized understanding of.  ADJUSTABLE GASTRIC BAND DISCHARGE INSTRUCTIONS  Drs. Fredrik Rigger, [place], Wilson, and [place] Call if you have any problems.   Call [phone] and ask for the surgeon on call.    If you need immediate assistance come to the ER at [facility]. Tell the ER personnel that you are a new post-op gastric banding patient. Signs and symptoms to report:   Severe vomiting or nausea. If you cannot tolerate clear liquids for longer than 1 day, you need to call your surgeon.    Abdominal pain which does not get better after taking your pain medication   Fever greater than 101 F degree   Difficulty breathing   Chest pain    Redness, swelling, drainage, or foul odor at incision sites    If your incisions open or pull apart   Swelling or pain in calf (lower leg)   Diarrhea, frequent watery, uncontrolled bowel movements.   Constipation, (no bowel movements for 3 days) if this occurs, Take Milk of Magnesia, 2 tablespoons by mouth, 3 times a day for 2 days if needed.  Call your doctor if constipation continues. Stop taking Milk of Magnesia once you have had a bowel movement. You may also use Miralax according to the label instructions.   Anything you consider "abnormal for you".   Normal side effects after Surgery:   Unable to sleep at night or concentrate   Irritability   Being tearful (crying) or depressed   These are common complaints, possibly related to your anesthesia, stress of surgery and change in lifestyle, that usually go away a few weeks after surgery.  If these  feelings continue, call your medical doctor.  Wound Care You may have surgical glue, steri-strips, or staples over your incisions after surgery.  Surgical glue:  Looks like a clear film over your incisions and will wear off gradually. Steri-strips: Strips of tape over your incisions. You may notice a yellowish color on the skin underneath the steri-strips. This is a substance used to make the steri-strips stick better. Do not pull the steri-strips off - let them fall off. Staples: Cherlynn Polo may be removed before you leave the hospital. If you go home with staples, call [facility] [phone]) for an appointment with your surgeon's nurse to have staples removed in 7 - 10 days. Showering: You may shower two days after your surgery unless otherwise instructed by your surgeon. Wash gently around wounds with warm soapy water, rinse well, and gently pat dry.  If you have a drain, you may need someone to hold this while you shower. Avoid tub baths until staples are removed and incisions are healed.    Medications   Medications should be liquid or crushed if larger than the size of a dime.  Extended release pills should not be crushed.   Depending on the size and number of medications you take, you may need to stagger/change the time you take your medications so that you do not over-fill your pouch.    Make sure you follow-up with your primary care physician to make medication adjustments needed during rapid weight loss and life-style  adjustment.   If you are diabetic, follow up with the doctor that prescribes your diabetes medication(s) within one week after surgery and check your blood sugar regularly.   Do not drive while taking narcotics!   Do not take acetaminophen (Tylenol) and Roxicet or Lortab Elixir at the same time since these pain medications contain acetaminophen.  Diet at home: (First 2 Weeks)  You will see the nutritionist two weeks after your surgery. She will advance your  diet if you are tolerating liquids well. Once at home, if you have severe vomiting or nausea and cannot tolerate clear liquids lasting longer than 1 day, call your surgeon.  For Same Day Surgery Discharge Patients: The day of surgery drink water only: 2 ounces every 4 hours. If you are tolerating water, begin drinking your high protein shake the next morning. For Overnight Stay Patients: Begin high protein shake 2 ounces every 3 hours, 5 - 6 times per day.  Gradually increase the amount you drink as tolerated.  You may find it easier to slowly sip shakes throughout the day.  It is important to get your proteins in first.   Protein Shake   Drink at least 2 ounces of shake 5-6 times per day   Each serving of protein shakes should have a minimum of 15 grams of protein and no more than 5 grams of carbohydrate    Increase the amount of protein shake you drink as tolerated   Protein powder may be added to fluids such as non-fat milk or Lactaid milk (limit to 20 grams added protein powder per serving   The initial goal is to drink at least 8 ounces of protein shake/drink per day (or as directed by the nutritionist). Some examples of protein shakes are [employer], [employer], EAS Edge HP, and [employer]jury. Hydration   Gradually increase the amount of water and other liquids as tolerated (See Acceptable Fluids)   Gradually increase the amount of protein shake as tolerated     Sip fluids slowly and throughout the day   May use Sugar substitutes, use sparingly (limit to 6 - 8 packets per day).  Your fluid goal is 64 ounces of fluid daily. It may take a few weeks to build up to this.         32 oz (or more) should be clear liquids and 32 oz (or more) should be full liquids.         Liquids should not contain sugar, caffeine, or carbonation!  Acceptable Fluids Clear Liquids:   Water or Sugar-free flavored water, Fruit H2O   Decaffeinated coffee or tea (sugar-free)   Crystal Lite, Wyler's Lite, Minute  Maid Lite   Sugar-free Jell-O   Bouillon or broth   Sugar-free Popsicle:   *Less than 20 calories each; Limit 1 per day   Full Liquids:              Protein Shakes/Drinks + 2 choices per day of other full liquids shown below.    Other full liquids must be: No more than 12 grams of Carbs per serving,  No more than 3 grams of Fat per serving   Strained low-fat cream soup   Non-Fat milk   Fat-free Lactaid Milk   Sugar-free yogurt (Dannon Lite & Fit) Vitamins and Minerals (Start 1 day after surgery unless otherwise directed)   1 Chewable Multivitamin / Multimineral Supplement (i.e. Centrum for Adults)   Chewable Calcium Citrate with Vitamin D-3. Take 1500 mg each day.           (  Example: 3 Chewable Calcium Plus 600 with Vitamin D-3 can be found at [facility])           Do not mix multivitamins containing iron with calcium supplements; take 2 hours   apart   Do not substitute Tums (calcium carbonate) for your calcium   Menstruating women and those at risk for anemia may need extra iron. Talk with your doctor to see if you need additional iron.     If you need extra iron:  Total daily Iron recommendations (including Vitamins) = 50 - 100 mg Iron/day Do not stop taking or change any vitamins or minerals until you talk to your nutritionist or surgeon. Your nutritionist and / or physician must approve all vitamin and mineral supplements. Exercise For maximum success, begin exercising as soon as your doctor recommends. Make sure your physician approves any physical activity.   Depending on fitness level, begin with a simple walking program   Walk 5-15 minutes each day, 7 days per week.    Slowly increase until you are walking 30-45 minutes per day   Consider joining our BELT program. [phone] or email [email or web address] [email or web address] Things to remember:   You may have sexual relations when you feel comfortable. It is VERY important for female patients to use a reliable birth control method. Fertility often increases  after surgery. Do not get pregnant for at least 18 months.   It is very important to keep all follow up appointments with your surgeon, nutritionist, primary care physician, and behavioral health practitioner. After the first year, please follow up with your bariatric surgeon at least once a year in order to maintain best weight loss results.  [facility]: [phone] [facility]: [phone]   Free counseling is available for you and your family through collaboration between [facility] and [facility]. Please call [phone] and leave a message.    Consider purchasing a medical alert bracelet that says you had lap-band surgery.    [facility] has a free Bariatric Surgery Support Group that meets monthly, the 3rd Thursday, 6 pm, Classroom #1, [place]. You may register online at [email or web address], but registration is not necessary. Select Classes and Support Groups, Bariatric Surgery, or Call [phone]   Do not return to work or drive until cleared by your surgeon   Use your CPAP when sleeping if applicable   Do not lift anything greater than ten pounds for at least two weeks.   You will probably have your first fill (fluid added to your band) 6 weeks after surgery  Talmadge Chad, RN Bariatric Nurse Coordinator

## 2012-03-10 ENCOUNTER — Telehealth (INDEPENDENT_AMBULATORY_CARE_PROVIDER_SITE_OTHER): Payer: Self-pay

## 2012-03-10 NOTE — Telephone Encounter (Signed)
Return call to patient-- No answer, left message on voicemail with follow up appointment information ( 03/25/12 @ 11:25 w/Dr. Johna Sheriff )

## 2012-03-17 ENCOUNTER — Encounter (INDEPENDENT_AMBULATORY_CARE_PROVIDER_SITE_OTHER): Payer: BC Managed Care – PPO

## 2012-03-25 ENCOUNTER — Ambulatory Visit (INDEPENDENT_AMBULATORY_CARE_PROVIDER_SITE_OTHER): Payer: BC Managed Care – PPO | Admitting: General Surgery

## 2012-03-25 ENCOUNTER — Encounter (INDEPENDENT_AMBULATORY_CARE_PROVIDER_SITE_OTHER): Payer: Self-pay | Admitting: General Surgery

## 2012-03-25 VITALS — BP 108/68 | HR 68 | Temp 98.4°F | Resp 16 | Ht 64.5 in | Wt 129.8 lb

## 2012-03-25 DIAGNOSIS — Z09 Encounter for follow-up examination after completed treatment for conditions other than malignant neoplasm: Secondary | ICD-10-CM

## 2012-03-25 NOTE — Progress Notes (Signed)
History: The patient returns for her first visit about 3 weeks following revision of slipped lap band. She feels that her preoperative symptoms of nausea and reflux and pain have been completely relieved. She is tolerating fluids and now soft diet without difficulty.  Exam: BP 108/68  Pulse 68  Temp 98.4 F (36.9 C) (Temporal)  Resp 16  Ht 5' 4.5" (1.638 m)  Wt 129 lb 12.8 oz (58.877 kg)  BMI 21.94 kg/m2  LMP 03/01/2012 Weight of 7 pounds from preop  General: Appears well Abdomen: Soft and nontender wounds are well healed  Assessment and plan: Doing very well following revision of lap band. I'll see her back in one month and we will consider a fill at that time.

## 2012-03-28 ENCOUNTER — Telehealth (INDEPENDENT_AMBULATORY_CARE_PROVIDER_SITE_OTHER): Payer: Self-pay

## 2012-03-28 NOTE — Telephone Encounter (Signed)
Message copied by Maryan Puls on Mon Mar 28, 2012  4:56 PM ------      Message from: Leanne Chang      Created: Fri Mar 25, 2012 12:23 PM      Regarding: Dr Johna Sheriff      Contact: [phone]       Need lap band fill around 04/22/12. Patient is free the entire week.

## 2012-03-28 NOTE — Telephone Encounter (Signed)
Patient given lap band follow up appointment scheduled for 04/21/12 @ 12:15 pm w/Dr. Johna Sheriff.

## 2012-04-11 ENCOUNTER — Other Ambulatory Visit (INDEPENDENT_AMBULATORY_CARE_PROVIDER_SITE_OTHER): Payer: Self-pay

## 2012-04-19 ENCOUNTER — Encounter: Payer: BC Managed Care – PPO | Attending: General Surgery | Admitting: *Deleted

## 2012-04-19 DIAGNOSIS — Z9884 Bariatric surgery status: Secondary | ICD-10-CM | POA: Insufficient documentation

## 2012-04-19 DIAGNOSIS — Z713 Dietary counseling and surveillance: Secondary | ICD-10-CM | POA: Insufficient documentation

## 2012-04-19 DIAGNOSIS — Z09 Encounter for follow-up examination after completed treatment for conditions other than malignant neoplasm: Secondary | ICD-10-CM | POA: Insufficient documentation

## 2012-04-19 NOTE — Progress Notes (Addendum)
  Follow-up visit:  4 Years Post-Operative LAGB Surgery  Medical Nutrition Therapy:  Appt start time: 1100  End time:  1230.  Primary concerns today: Post-operative Bariatric Surgery Nutrition Management. Lori Summers is here to establish care. Had LAGB 4 years ago and recently had revision for band slip in 02/2012. Has been under increased stress lately with unexpected death of brother 3 weeks ago. Also has absolutely no restriction at all. Is trying to control portion sizes, but finds herself eating more than she could before the revision surgery. Has been eating Campbell's Chunky Soups to stay satisfied longer, but causing weight gain; likely as fluid. Pt is also on her menstrual cycle during which she state she gains 8-10 lbs of fluid. Struggles with anemia and received monthly iron infusions until recently.  TANITA  BODY COMP RESULTS  04/19/12   BMI (kg/m^2) 25.3   Fat Mass (lbs) 23.5   Fat Free Mass (lbs) 126.0   Total Body Water (lbs) 92.0   24-hr recall: B (AM): 1 pkt oatmeal (plain) w/ milk; 1 pkt regular hot chocolate - (2-4g) Snk (AM): None  L (PM): 1/2 can Campbell's Hearty Soup (potatoes, chicken, and broccoli) - (7g) Snk (PM): other 1/2 can Campbell's Hearty Soup (potatoes, chicken, and broccoli) - (7g)  D (PM): Tuna fish (canned in water) 3-4 oz - (30-40g) Snk (PM): None *Sometimes adds protein shake during a snack  Fluid intake: > 64 oz Estimated total protein intake: 50-60g (if no protein shake)  Medications: See list Supplementation: Taking as directed   CBG monitoring:  Every 1-2 weeks Average CBG per patient: 111 mg Last patient reported A1c: Unknown  Using straws: No Drinking while eating: Waits only 15 min Hair loss: No Carbonated beverages: No N/V/D/C: None at this time Last Lap-Band fill:  No fill since revision surgery in 02/2012; Pt reports she is "starving" and is eating large portions.  Likely needs fill to get back restriction she had prior to  surgery.  Recent physical activity:  Walks 3 times/week  Progress Towards Goal(s):  In progress.   Nutritional Diagnosis:  NB-1.1 Food and nutrition-related knowledge deficit related to recent Lapband revision surgery after 4 years as evidenced by patient referral for LAGB refresher.    Intervention:  Nutrition education/reinforcement.  Monitoring/Evaluation:  Dietary intake, exercise, lap band fills, and body weight. Follow up prn for post-op visits and Tanita scans.

## 2012-04-19 NOTE — Patient Instructions (Addendum)
Goals:  Avoid high sodium and highly processed foods.  Increase lean protein foods to meet 60-80g goal  Increase fluid intake to 64oz +  Avoid drinking 15 minutes before, during and 30 minutes after eating  Aim for >30 min of physical activity daily  Talk with PCP about adding iron if needed  Follow up with surgeon for fill

## 2012-04-20 ENCOUNTER — Encounter: Payer: Self-pay | Admitting: *Deleted

## 2012-04-21 ENCOUNTER — Encounter (INDEPENDENT_AMBULATORY_CARE_PROVIDER_SITE_OTHER): Payer: Self-pay | Admitting: General Surgery

## 2012-04-21 ENCOUNTER — Ambulatory Visit (INDEPENDENT_AMBULATORY_CARE_PROVIDER_SITE_OTHER): Payer: BC Managed Care – PPO | Admitting: General Surgery

## 2012-04-21 VITALS — BP 126/82 | HR 80 | Resp 18 | Ht 64.0 in | Wt 148.8 lb

## 2012-04-21 DIAGNOSIS — Z9884 Bariatric surgery status: Secondary | ICD-10-CM

## 2012-04-21 NOTE — Progress Notes (Signed)
History: Patient returns for more long-term followup for lap band placed in 2009 with laparoscopic repair of slipped band in late October. She has continued to do well. She has no regurgitation or pain or reflux or other GI symptoms. She has been exercising portion control but has been gaining weight rapidly which she actually needed to do as her BMI was 18 at the time of her slip.  Exam: BP 126/82  Pulse 80  Resp 18  Ht 5\' 4"  (1.626 m)  Wt 148 lb 12.8 oz (67.495 kg)  BMI 25.54 kg/m2 Total weight loss since surgery 81 pounds, upper 39 pounds since last visit General: She appears very well Abdomen: Soft and nontender with well-healed  Assessment and plan: Doing very well following revision of her slipped lap band. We went ahead with a 2 cc fill today which he tolerated well. She will likely need about a 4/2 cc in her band. Return in one month.

## 2012-04-22 ENCOUNTER — Encounter (INDEPENDENT_AMBULATORY_CARE_PROVIDER_SITE_OTHER): Payer: BC Managed Care – PPO | Admitting: General Surgery

## 2012-05-19 ENCOUNTER — Encounter (INDEPENDENT_AMBULATORY_CARE_PROVIDER_SITE_OTHER): Payer: Self-pay | Admitting: General Surgery

## 2012-05-19 ENCOUNTER — Ambulatory Visit (INDEPENDENT_AMBULATORY_CARE_PROVIDER_SITE_OTHER): Payer: BC Managed Care – PPO | Admitting: General Surgery

## 2012-05-19 VITALS — BP 122/88 | HR 76 | Temp 98.2°F | Resp 16 | Ht 65.0 in | Wt 162.0 lb

## 2012-05-19 DIAGNOSIS — Z9884 Bariatric surgery status: Secondary | ICD-10-CM

## 2012-05-19 NOTE — Progress Notes (Signed)
History: Patient returns for followup after revision of her slipped lap band in 03/08/2012. He did an initial refilling in December one month ago. She continues to have no significant restriction and strong appetite.  Exam: BP 122/88  Pulse 76  Temp 98.2 F (36.8 C) (Temporal)  Resp 16  Ht 5\' 5"  (1.651 m)  Wt 162 lb (73.483 kg)  BMI 26.96 kg/m2 Weight of 13 pounds since last visit, down 68 pounds from initial presentation  General: Appears well Abdomen: Soft nontender port site fine  With this information we went ahead with a 2 cc fill today to bring her up to 4 cc. I suspect she will need about 4.5 cc total. Return in 3-4 weeks.

## 2012-06-10 ENCOUNTER — Encounter (INDEPENDENT_AMBULATORY_CARE_PROVIDER_SITE_OTHER): Payer: Self-pay | Admitting: General Surgery

## 2012-06-10 ENCOUNTER — Ambulatory Visit (INDEPENDENT_AMBULATORY_CARE_PROVIDER_SITE_OTHER): Payer: BC Managed Care – PPO | Admitting: General Surgery

## 2012-06-10 VITALS — BP 132/84 | HR 76 | Temp 98.4°F | Resp 18 | Ht 64.5 in | Wt 167.6 lb

## 2012-06-10 DIAGNOSIS — Z9884 Bariatric surgery status: Secondary | ICD-10-CM

## 2012-06-10 NOTE — Progress Notes (Signed)
Chief complaint: Followup lap band  History: Patient returns for further followup status post lap band placement October 2009 and status post ReVision of slipped lap band in October 2013. We have been refilling her band gradually since her revisional surgery. She is very concerned about her weight regain and the fact that her hemoglobin A1c has come up a little bit. She currently has 4 cc in her band and based on her previous over restriction and slippery think she will need somewhere between 4 and 5.  Exam: BP 132/84  Pulse 76  Temp 98.4 F (36.9 C)  Resp 18  Ht 5' 4.5" (1.638 m)  Wt 167 lb 9.6 oz (76.023 kg)  BMI 28.32 kg/m2 5 pounds from last visit Abdomen: Soft and nontender with well-healed wounds port site looks fine  Assessment and plan: This information I went ahead with a 1/2 cc fill today which should give her an expected volume of 4.5 cc. She tolerated water well. I reassured her that we will get her band back working and get her weight stabilized. She will return in 4 weeks bipolar she could call sooner should she not notice any difference with this fill.

## 2012-06-17 ENCOUNTER — Encounter (INDEPENDENT_AMBULATORY_CARE_PROVIDER_SITE_OTHER): Payer: BC Managed Care – PPO | Admitting: General Surgery

## 2012-06-20 ENCOUNTER — Telehealth (INDEPENDENT_AMBULATORY_CARE_PROVIDER_SITE_OTHER): Payer: Self-pay | Admitting: General Surgery

## 2012-06-20 NOTE — Telephone Encounter (Signed)
Patient called in stating that since her last lap band visit she has been gaining weight and wanted to be seen by Dr. Johna Sheriff to have fluid added to the band. I advised the patient that he was not in the office this week and offered her to be seen in lap band clinic, whichever was available first. Had to advise the patient that the first available clinic with Dr. Johna Sheriff is on 06/30/12. Advised the patient that he is out of the office (vacation) and that is the first available time. Patient was insistent and kept repeating to make sure the message was given to [place]. I had to advise the patient that we both have access to the same schedule information and that I would forward the information to [place].

## 2012-06-30 ENCOUNTER — Encounter (INDEPENDENT_AMBULATORY_CARE_PROVIDER_SITE_OTHER): Payer: BC Managed Care – PPO | Admitting: General Surgery

## 2012-07-04 ENCOUNTER — Telehealth (INDEPENDENT_AMBULATORY_CARE_PROVIDER_SITE_OTHER): Payer: Self-pay

## 2012-07-04 NOTE — Telephone Encounter (Signed)
Return call to patient- appointment has been rescheduled to 07/07/12 @ 9:30 am w/Andy LBC.  Patient original appointment was scheduled for 06/30/12 but was cxl'd due to bad weather and office closing.

## 2012-07-04 NOTE — Telephone Encounter (Signed)
Message copied by Maryan Puls on Mon Jul 04, 2012  2:01 PM ------      Message from: Delcie Roch      Created: Mon Jul 04, 2012 12:05 PM      Regarding: 2-week reck that was cancelled      Contact: [phone]       Please contact patient to r/s 2-week lap band recheck.  She has gained seven pounds.  Dr. Johna Sheriff wanted to see her in two-weeks. ------

## 2012-07-07 ENCOUNTER — Encounter (INDEPENDENT_AMBULATORY_CARE_PROVIDER_SITE_OTHER): Payer: Self-pay

## 2012-07-07 ENCOUNTER — Ambulatory Visit (INDEPENDENT_AMBULATORY_CARE_PROVIDER_SITE_OTHER): Payer: BC Managed Care – PPO | Admitting: Physician Assistant

## 2012-07-07 VITALS — BP 110/76 | HR 80 | Temp 99.2°F | Resp 18 | Ht 65.0 in | Wt 171.8 lb

## 2012-07-07 DIAGNOSIS — Z4651 Encounter for fitting and adjustment of gastric lap band: Secondary | ICD-10-CM

## 2012-07-07 NOTE — Patient Instructions (Signed)
Take clear liquids tonight. Thin protein shakes are ok to start tomorrow morning. Slowly advance your diet thereafter. Call us if you have persistent vomiting or regurgitation, night cough or reflux symptoms. Return as scheduled or sooner if you notice no changes in hunger/portion sizes.  

## 2012-07-07 NOTE — Progress Notes (Signed)
  HISTORY: Lori Summers is a 52 y.o.female who received an AP-Standard lap-band in October 2009 by Dr. Johna Sheriff with a revision in October 2013. She has been getting progressive fills since her revision. Her weight gain is slowing down. She's most concerned with relapse of diabetes and less so with appearance. She denies regurgitation or reflux but does recount persistent hunger and larger than desired portions. She is avoiding carbohydrates. She wants a fill today.  VITAL SIGNS: Filed Vitals:   07/07/12 1016  BP: 110/76  Pulse: 80  Temp: 99.2 F (37.3 C)  Resp: 18    PHYSICAL EXAM: Physical exam reveals a very well-appearing 52 y.o.female in no apparent distress Neurologic: Awake, alert, oriented Psych: Bright affect, conversant Respiratory: Breathing even and unlabored. No stridor or wheezing Abdomen: Soft, nontender, nondistended to palpation. Incisions well-healed. No incisional hernias. Port easily palpated. Extremities: Atraumatic, good range of motion.  ASSESMENT: 52 y.o.  female  s/p AP-Standard lap-band.   PLAN: The patient's port was accessed with a 20G Huber needle without difficulty. Clear fluid was aspirated and 0.5 mL saline was added to the port to give a total predicted volume of 5 mL. The patient was able to swallow water without difficulty following the procedure and was instructed to take clear liquids for the next 24-48 hours and advance slowly as tolerated.

## 2012-07-15 ENCOUNTER — Encounter (INDEPENDENT_AMBULATORY_CARE_PROVIDER_SITE_OTHER): Payer: BC Managed Care – PPO | Admitting: General Surgery

## 2012-07-21 ENCOUNTER — Ambulatory Visit: Payer: BC Managed Care – PPO | Admitting: *Deleted

## 2012-07-25 ENCOUNTER — Ambulatory Visit: Payer: BC Managed Care – PPO | Admitting: *Deleted

## 2012-08-04 ENCOUNTER — Ambulatory Visit (INDEPENDENT_AMBULATORY_CARE_PROVIDER_SITE_OTHER): Payer: BC Managed Care – PPO | Admitting: Physician Assistant

## 2012-08-04 ENCOUNTER — Encounter (INDEPENDENT_AMBULATORY_CARE_PROVIDER_SITE_OTHER): Payer: BC Managed Care – PPO

## 2012-08-04 ENCOUNTER — Encounter (INDEPENDENT_AMBULATORY_CARE_PROVIDER_SITE_OTHER): Payer: Self-pay

## 2012-08-04 VITALS — BP 140/84 | HR 106 | Temp 99.0°F | Resp 20 | Ht 65.0 in | Wt 176.2 lb

## 2012-08-04 DIAGNOSIS — Z4651 Encounter for fitting and adjustment of gastric lap band: Secondary | ICD-10-CM

## 2012-08-04 NOTE — Progress Notes (Signed)
  HISTORY: Lori Summers is a 52 y.o.female who received an AP-Standard lap-band in October 2009 with a revision in October 2013 by Dr. Johna Sheriff. She comes in with continued hunger and weight gain. It has slowed however. She is concerned about not doing the right things but it appears that she's not yet in the green zone. She's carefully watching her intake with regard to food choices. She hasn't been able to exercise due to a sprained ankle so this may hinder her progress as well.  VITAL SIGNS: Filed Vitals:   08/04/12 0936  BP: 140/84  Pulse: 106  Temp: 99 F (37.2 C)  Resp: 20    PHYSICAL EXAM: Physical exam reveals a very well-appearing 52 y.o.female in no apparent distress Neurologic: Awake, alert, oriented Psych: Bright affect, conversant Respiratory: Breathing even and unlabored. No stridor or wheezing Abdomen: Soft, nontender, nondistended to palpation. Incisions well-healed. No incisional hernias. Port easily palpated. Extremities: Atraumatic, good range of motion.  ASSESMENT: 52 y.o.  female  s/p AP-Standard lap-band.   PLAN: The patient's port was accessed with a 20G Huber needle without difficulty. Clear fluid was aspirated and 0.5 mL saline was added to the port to give a total predicted volume of 5.5 mL. The patient was able to swallow water without difficulty following the procedure and was instructed to take clear liquids for the next 24-48 hours and advance slowly as tolerated. We talked at length about how fills after a revision may not equal the volumes she had prior to to the operation. She voiced understanding.

## 2012-08-04 NOTE — Patient Instructions (Signed)
Take clear liquids tonight. Thin protein shakes are ok to start tomorrow morning. Slowly advance your diet thereafter. Call us if you have persistent vomiting or regurgitation, night cough or reflux symptoms. Return as scheduled or sooner if you notice no changes in hunger/portion sizes.  

## 2012-08-12 ENCOUNTER — Encounter (INDEPENDENT_AMBULATORY_CARE_PROVIDER_SITE_OTHER): Payer: BC Managed Care – PPO | Admitting: General Surgery

## 2012-08-16 ENCOUNTER — Telehealth (INDEPENDENT_AMBULATORY_CARE_PROVIDER_SITE_OTHER): Payer: Self-pay

## 2012-08-16 NOTE — Telephone Encounter (Signed)
Spoke to patient regarding appointment time change for 09/08/12.  Patient okay with 11:30 appointment and okay to see Mardelle Matte in [facility] Band [facility].

## 2012-09-08 ENCOUNTER — Ambulatory Visit (INDEPENDENT_AMBULATORY_CARE_PROVIDER_SITE_OTHER): Payer: BC Managed Care – PPO | Admitting: Physician Assistant

## 2012-09-08 ENCOUNTER — Encounter (INDEPENDENT_AMBULATORY_CARE_PROVIDER_SITE_OTHER): Payer: Self-pay

## 2012-09-08 VITALS — BP 144/78 | HR 84 | Temp 97.8°F | Ht 65.0 in | Wt 170.4 lb

## 2012-09-08 DIAGNOSIS — Z4651 Encounter for fitting and adjustment of gastric lap band: Secondary | ICD-10-CM

## 2012-09-08 NOTE — Progress Notes (Signed)
  HISTORY: Lori Summers is a 52 y.o.female who received an AP-Standard lap-band in October 2009 by Dr. Johna Sheriff. She comes in with 6 lbs weight loss since her last visit. She has no regurgitation or reflux symptoms. She says her hunger has improved but it's still present. Her portion sizes are continuing to get smaller.  VITAL SIGNS: Filed Vitals:   09/08/12 1139  BP: 144/78  Pulse: 84  Temp: 97.8 F (36.6 C)    PHYSICAL EXAM: Physical exam reveals a very well-appearing 52 y.o.female in no apparent distress Neurologic: Awake, alert, oriented Psych: Bright affect, conversant Respiratory: Breathing even and unlabored. No stridor or wheezing Abdomen: Soft, nontender, nondistended to palpation. Incisions well-healed. No incisional hernias. Port easily palpated. Extremities: Atraumatic, good range of motion.  ASSESMENT: 52 y.o.  female  s/p AP-Standard lap-band.   PLAN: The patient's port was accessed with a 20G Huber needle without difficulty. Clear fluid was aspirated and 0.25 mL saline was added to the port to give a total predicted volume of 5.75 mL. The patient was able to swallow water without difficulty following the procedure and was instructed to take clear liquids for the next 24-48 hours and advance slowly as tolerated.

## 2012-09-08 NOTE — Patient Instructions (Signed)
Take clear liquids tonight. Thin protein shakes are ok to start tomorrow morning. Slowly advance your diet thereafter. Call us if you have persistent vomiting or regurgitation, night cough or reflux symptoms. Return as scheduled or sooner if you notice no changes in hunger/portion sizes.  

## 2012-10-07 ENCOUNTER — Encounter (INDEPENDENT_AMBULATORY_CARE_PROVIDER_SITE_OTHER): Payer: Self-pay | Admitting: General Surgery

## 2012-10-07 ENCOUNTER — Ambulatory Visit (INDEPENDENT_AMBULATORY_CARE_PROVIDER_SITE_OTHER): Payer: BC Managed Care – PPO | Admitting: General Surgery

## 2012-10-07 VITALS — BP 134/88 | HR 58 | Temp 98.0°F | Ht 65.0 in | Wt 160.8 lb

## 2012-10-07 DIAGNOSIS — Z9884 Bariatric surgery status: Secondary | ICD-10-CM

## 2012-10-07 NOTE — Progress Notes (Signed)
Chief complaint: Followup lap band  History: Patient returns for followup of her lap band placed October 2009 and subsequent revision for a slip in October 2013. When we fixture slippage of the fluid out of her band she had weight gain up to about 180 pounds was very distressed about this. We have been refilling since. She last had a fill one month ago. Since then she lost an additional 10 pounds to 160 and BMI of 26.8. She feels she could use a little more restriction but does admit that she is feeling great and not eating excessive amounts and has progressive weight loss.  Exam: BP 134/88  Pulse 58  Temp(Src) 98 F (36.7 C) (Temporal)  Ht 5\' 5"  (1.651 m)  Wt 160 lb 12.8 oz (72.938 kg)  BMI 26.76 kg/m2  SpO2 98% Total weight loss 70 pounds from preop and 10 pounds from last visit  Assessment and plan: Doing well with very good progressive weight loss and no evidence of complication. We elected to not do a fill today due to her good ongoing progress. She has an appointment to see me in 5 weeks.

## 2012-10-07 NOTE — Patient Instructions (Signed)
Keep up the good work

## 2012-11-03 ENCOUNTER — Encounter (INDEPENDENT_AMBULATORY_CARE_PROVIDER_SITE_OTHER): Payer: BC Managed Care – PPO

## 2012-11-10 ENCOUNTER — Telehealth (INDEPENDENT_AMBULATORY_CARE_PROVIDER_SITE_OTHER): Payer: Self-pay | Admitting: General Surgery

## 2012-11-10 NOTE — Telephone Encounter (Signed)
LMOM that patient has appointment 8/14 @ 8:30 with [facility] Band Clinic that needs to be changed. Her two options are a) move appointment from 8/14 to 8/7 (Morning Only) b) move appointment from 8/14 to 8/21 (Any time).

## 2012-12-01 ENCOUNTER — Encounter (INDEPENDENT_AMBULATORY_CARE_PROVIDER_SITE_OTHER): Payer: BC Managed Care – PPO

## 2012-12-29 ENCOUNTER — Encounter (INDEPENDENT_AMBULATORY_CARE_PROVIDER_SITE_OTHER): Payer: BC Managed Care – PPO

## 2013-01-05 ENCOUNTER — Encounter (INDEPENDENT_AMBULATORY_CARE_PROVIDER_SITE_OTHER): Payer: Self-pay

## 2013-01-05 ENCOUNTER — Ambulatory Visit (INDEPENDENT_AMBULATORY_CARE_PROVIDER_SITE_OTHER): Payer: BC Managed Care – PPO | Admitting: Physician Assistant

## 2013-01-05 VITALS — BP 128/80 | HR 66 | Temp 98.1°F | Resp 14 | Ht 65.0 in | Wt 135.0 lb

## 2013-01-05 DIAGNOSIS — Z4651 Encounter for fitting and adjustment of gastric lap band: Secondary | ICD-10-CM

## 2013-01-05 NOTE — Patient Instructions (Signed)
Return in two weeks. Focus on good food choices as well as physical activity. Return sooner if you have an increase in hunger, portion sizes or weight. Return also for difficulty swallowing, night cough, reflux. Take omeprazole (available over-the-counter) 20mg  tablet, one tablet per day for two weeks. Call if you still have difficulty swallowing despite removal of fluid today.

## 2013-01-05 NOTE — Progress Notes (Signed)
  HISTORY: Lori Summers is a 52 y.o.female who received an AP-Standard lap-band in October 2009 by Dr. Johna Sheriff. She has been doing well with her last adjustment until four days ago when she began having progressive dysphagia. She has had nocturnal reflux for the past two nights. She has lost 26 lbs since late May. She cannot tolerate solids at all. She recalls no inciting events.  VITAL SIGNS: Filed Vitals:   01/05/13 0841  BP: 128/80  Pulse: 66  Temp: 98.1 F (36.7 C)  Resp: 14    PHYSICAL EXAM: Physical exam reveals a very well-appearing 52 y.o.female in no apparent distress Neurologic: Awake, alert, oriented Psych: Bright affect, conversant Respiratory: Breathing even and unlabored. No stridor or wheezing Abdomen: Soft, nontender, nondistended to palpation. Incisions well-healed. No incisional hernias. Port easily palpated. Extremities: Atraumatic, good range of motion.  ASSESMENT: 52 y.o.  female  s/p AP-Standard lap-band.   PLAN: The patient's port was accessed with a 20G Huber needle without difficulty. Clear fluid was aspirated and 0.75 mL saline was removed from the port to give a total predicted volume of 5 mL. The patient was advised to concentrate on healthy food choices and to avoid slider foods high in fats and carbohydrates. She was able to swallow water without difficulty. I have started her on omperazole daily for two weeks and asked her to return asap if her symptoms return or worsen.

## 2013-01-10 ENCOUNTER — Inpatient Hospital Stay (HOSPITAL_COMMUNITY)
Admission: EM | Admit: 2013-01-10 | Discharge: 2013-01-13 | DRG: 296 | Disposition: A | Discharge status: Home / Self Care | Payer: BC Managed Care – PPO | Attending: Internal Medicine | Admitting: Internal Medicine

## 2013-01-10 ENCOUNTER — Encounter (HOSPITAL_COMMUNITY): Payer: Self-pay | Admitting: Emergency Medicine

## 2013-01-10 DIAGNOSIS — I10 Essential (primary) hypertension: Secondary | ICD-10-CM | POA: Diagnosis present

## 2013-01-10 DIAGNOSIS — E119 Type 2 diabetes mellitus without complications: Secondary | ICD-10-CM | POA: Diagnosis present

## 2013-01-10 DIAGNOSIS — R933 Abnormal findings on diagnostic imaging of other parts of digestive tract: Secondary | ICD-10-CM | POA: Diagnosis present

## 2013-01-10 DIAGNOSIS — Y838 Other surgical procedures as the cause of abnormal reaction of the patient, or of later complication, without mention of misadventure at the time of the procedure: Secondary | ICD-10-CM | POA: Diagnosis present

## 2013-01-10 DIAGNOSIS — M109 Gout, unspecified: Secondary | ICD-10-CM | POA: Diagnosis present

## 2013-01-10 DIAGNOSIS — E876 Hypokalemia: Principal | ICD-10-CM

## 2013-01-10 DIAGNOSIS — R112 Nausea with vomiting, unspecified: Secondary | ICD-10-CM | POA: Diagnosis present

## 2013-01-10 DIAGNOSIS — D649 Anemia, unspecified: Secondary | ICD-10-CM

## 2013-01-10 DIAGNOSIS — Z8249 Family history of ischemic heart disease and other diseases of the circulatory system: Secondary | ICD-10-CM

## 2013-01-10 DIAGNOSIS — K9509 Other complications of gastric band procedure: Secondary | ICD-10-CM | POA: Diagnosis present

## 2013-01-10 DIAGNOSIS — Z9884 Bariatric surgery status: Secondary | ICD-10-CM

## 2013-01-10 DIAGNOSIS — Z79899 Other long term (current) drug therapy: Secondary | ICD-10-CM

## 2013-01-10 DIAGNOSIS — F411 Generalized anxiety disorder: Secondary | ICD-10-CM | POA: Diagnosis present

## 2013-01-10 DIAGNOSIS — K219 Gastro-esophageal reflux disease without esophagitis: Secondary | ICD-10-CM | POA: Diagnosis present

## 2013-01-10 HISTORY — DX: Gastro-esophageal reflux disease without esophagitis: K21.9

## 2013-01-10 HISTORY — DX: Anemia, unspecified: D64.9

## 2013-01-10 HISTORY — DX: Pure hypercholesterolemia, unspecified: E78.00

## 2013-01-10 HISTORY — DX: Gout, unspecified: M10.9

## 2013-01-10 HISTORY — DX: Anxiety disorder, unspecified: F41.9

## 2013-01-10 LAB — CBC WITH DIFFERENTIAL/PLATELET
Basophils Relative: 1 % (ref 0–1)
Eosinophils Absolute: 0.1 10*3/uL (ref 0.0–0.7)
Eosinophils Relative: 2 % (ref 0–5)
Hemoglobin: 10.1 g/dL — ABNORMAL LOW (ref 12.0–15.0)
Lymphs Abs: 1.4 10*3/uL (ref 0.7–4.0)
MCH: 21.7 pg — ABNORMAL LOW (ref 26.0–34.0)
MCHC: 32.1 g/dL (ref 30.0–36.0)
MCV: 67.6 fL — ABNORMAL LOW (ref 78.0–100.0)
Monocytes Absolute: 0.6 10*3/uL (ref 0.1–1.0)
Neutro Abs: 3.1 10*3/uL (ref 1.7–7.7)
RBC: 4.66 MIL/uL (ref 3.87–5.11)

## 2013-01-10 LAB — COMPREHENSIVE METABOLIC PANEL
ALT: 19 U/L (ref 0–35)
BUN: 5 mg/dL — ABNORMAL LOW (ref 6–23)
CO2: 36 mEq/L — ABNORMAL HIGH (ref 19–32)
Calcium: 9.9 mg/dL (ref 8.4–10.5)
Creatinine, Ser: 0.61 mg/dL (ref 0.50–1.10)
GFR calc non Af Amer: >90 mL/min (ref >90)
Glucose, Bld: 121 mg/dL — ABNORMAL HIGH (ref 70–99)
Total Protein: 7.2 g/dL (ref 6.0–8.3)

## 2013-01-10 MED ORDER — MAGNESIUM SULFATE 40 MG/ML IJ SOLN
2.0000 g | Freq: Once | INTRAMUSCULAR | Status: AC
  Administered 2013-01-11: 2 g via INTRAVENOUS
  Filled 2013-01-10 (×2): qty 50

## 2013-01-10 MED ORDER — SODIUM CHLORIDE 0.9 % IV SOLN
INTRAVENOUS | Status: AC
  Administered 2013-01-10: 18:00:00 via INTRAVENOUS

## 2013-01-10 MED ORDER — POTASSIUM CHLORIDE 20 MEQ/15ML (10%) PO LIQD
40.0000 meq | Freq: Once | ORAL | Status: AC
  Administered 2013-01-10: 40 meq via ORAL
  Filled 2013-01-10: qty 30

## 2013-01-10 MED ORDER — ONDANSETRON HCL 4 MG/2ML IJ SOLN: 4.0000 mg | Freq: Three times a day (TID) | INTRAMUSCULAR | Status: AC | PRN

## 2013-01-10 MED ORDER — POTASSIUM CHLORIDE 10 MEQ/100ML IV SOLN
10.0000 meq | INTRAVENOUS | Status: AC
  Administered 2013-01-10 (×2): 10 meq via INTRAVENOUS
  Filled 2013-01-10 (×3): qty 100

## 2013-01-10 MED ORDER — SODIUM CHLORIDE 0.9 % IJ SOLN
3.0000 mL | Freq: Two times a day (BID) | INTRAMUSCULAR | Status: DC
  Administered 2013-01-10 – 2013-01-11 (×2): 3 mL via INTRAVENOUS

## 2013-01-10 NOTE — ED Notes (Signed)
Pt sts sent here by PCP for hypokalemia verified x 3 of 2.1; pt with hx of lap band had GI upset last week with vomiting and also drinks ETOH

## 2013-01-10 NOTE — ED Notes (Signed)
Pt requests no labs to be drawn up front

## 2013-01-10 NOTE — ED Notes (Signed)
Pt sent here from PCP office for evaluation of low potassium levels verified by 2 blood draws- pt reports doctors office told her it was 2.0.  Pt denies any complaints at this time.  States she has had problems with her potassium being low about a year ago.  Alert and oriented, NSR on cardiac monitor, IV in place.

## 2013-01-10 NOTE — H&P (Addendum)
[facility] History and Physical  Lori Summers ZOX:[id number] DOB: 1961-01-28 DOA: 01/10/2013  Referring physician: er PCP: Lillia Carmel, MD  Specialists:   Chief Complaint: sent from dr office for low K  HPI: Lori Summers is a 52 y.o. female  with a hx of gastric bypass presents to the Emergency Department after she was found to be hypokalemic at her annual physical exam. Patient had nausea and vomiting for approximately one week before seeing her surgeon. She reports that nausea and vomiting resolved after she had 3 mL removed from her lap band. Patient reports she has felt fine since that time and has tolerated fluids and soft foods without difficulty. She denies further nausea or vomiting. She denies fever, chills, headache, chest pain, shortness of breath abdominal pain, diarrhea, weakness, dizziness, syncope, dysuria, hematuria. Patient's potassium was 2.1 yesterday when checked by the general surgeon and again 2 when checked at her PCPs.   In the Er, they gave 80 meq PO and 30 meg IV- will recheck K.  Patient is completely asymptomatic.    Review of Systems: all systems reviewed, negative unless stated above   Past Medical History  Diagnosis Date  . Nausea and vomiting     RESOLVED ON 03/03/12 AFTER DR REMOVED FLUID FROM GASTRIC BAND  . Weight loss   . Hypertension     NO PROBLEMS WITH HYPERTENSION SINCE WEIGHT LOSS  . Diabetes mellitus     NO PROBLEMS SINCE WEIGHT LOSS   Past Surgical History  Procedure Laterality Date  . Breast enhancement surgery  september 12,2011  . Breast enhancement surgery      corrections made in febuary and april 2012  . Laparoscopic gastric banding  2009   Social History:  reports that she has never smoked. She has never used smokeless tobacco. She reports that  drinks alcohol. She reports that she does not use illicit drugs.   No Known Allergies  Family History  Problem Relation Age of Onset  . Heart disease Father 29    heart  attack   . Cancer Father     melanoma  . Cancer Sister     lung    Prior to Admission medications   Medication Sig Start Date End Date Taking? Authorizing Provider  ALPRAZolam Prudy Feeler) 0.5 MG tablet Take 0.5 mg by mouth 3 (three) times daily as needed. Anxiety 03/06/11  Yes Historical Provider, MD  hydrochlorothiazide (MICROZIDE) 12.5 MG capsule Take 12.5 mg by mouth every morning.   Yes Historical Provider, MD  ibuprofen (ADVIL,MOTRIN) 100 MG/5ML suspension Take 250 mg by mouth every 6 (six) hours as needed. Pain 04/02/11  Yes Historical Provider, MD  Multiple Vitamins-Minerals (MULTIVITAMIN GUMMIES ADULT) CHEW Chew 1 tablet by mouth daily.   Yes Historical Provider, MD   Physical Exam: Filed Vitals:   01/10/13 1749  BP: 125/71  Pulse:   Temp: 98.2 F (36.8 C)  Resp: 18     General:  A+Ox3, NAD  Eyes: wnl  ENT: wnl  Neck: supple  Cardiovascular: rrr  Respiratory: clear  Abdomen: +BS, soft, NT  Skin: no rashes or lesions  Musculoskeletal: moves all 4 ext  Psychiatric: normal  Neurologic: no focal deficit  Labs on Admission:  Basic Metabolic Panel:  Recent Labs Lab 01/10/13 1614  NA 140  K <2.0*  CL 93*  CO2 36*  GLUCOSE 121*  BUN 5*  CREATININE 0.61  CALCIUM 9.9  MG 1.8   Liver Function Tests:  Recent Labs Lab 01/10/13 1614  AST 27  ALT 19  ALKPHOS 64  BILITOT 0.3  PROT 7.2  ALBUMIN 3.2*   No results found for this basename: LIPASE, AMYLASE,  in the last 168 hours No results found for this basename: AMMONIA,  in the last 168 hours CBC:  Recent Labs Lab 01/10/13 1614  WBC 5.3  NEUTROABS 3.1  HGB 10.1*  HCT 31.5*  MCV 67.6*  PLT 296   Cardiac Enzymes: No results found for this basename: CKTOTAL, CKMB, CKMBINDEX, TROPONINI,  in the last 168 hours  BNP (last 3 results) No results found for this basename: PROBNP,  in the last 8760 hours CBG: No results found for this basename: GLUCAP,  in the last 168 hours  Radiological Exams  on Admission: No results found.  EKG: Independently reviewed. NSR  Assessment/Plan Active Problems:   Hypokalemia   1. Hypokalemia- replace mag as on the lower side of normal, PO and IV replacement, recheck this PM and re-dose as needed, recheck in AM- s/p lap band, was also on HCTZ  (only takes when she gets her period- took last week) will d/c 2. S/p lap band 3. N/V- resolved    Code Status: full Family Communication: patient Disposition Plan:   Time spent: 70 min  Tiago Humphrey [facility] Pager [phone]  If 7PM-7AM, please contact night-coverage [email or web address] Password [facility] 01/10/2013, 6:21 PM

## 2013-01-10 NOTE — ED Provider Notes (Signed)
CSN: [id number]     Arrival date & time 01/10/13  1533 History   First MD Initiated Contact with Patient 01/10/13 1623     Chief Complaint  Patient presents with  . Abnormal Lab   (Consider location/radiation/quality/duration/timing/severity/associated sxs/prior Treatment) The history is provided by the patient and medical records. No language interpreter was used.    Lori Summers is a 52 y.o. female  with a hx of gastric bypass presents to the Emergency Department after she was found to be hypokalemic at her annual physical exam. Patient with a nausea and vomiting for approximately one week.  She reports that nausea and vomiting resolved after she had 3 mL removed from her lap band.  Patient reports she has felt fine since that time and has tolerated fluids and soft foods without difficulty. She denies further nausea or vomiting. She denies fever, chills, headache, chest pain, shortness of breath abdominal pain, diarrhea, weakness, dizziness, syncope, dysuria, hematuria. Patient's potassium was 2.1 yesterday when checked by the general surgeon.  Past Medical History  Diagnosis Date  . Nausea and vomiting     RESOLVED ON 03/03/12 AFTER DR REMOVED FLUID FROM GASTRIC BAND  . Weight loss   . Hypertension     NO PROBLEMS WITH HYPERTENSION SINCE WEIGHT LOSS  . Diabetes mellitus     NO PROBLEMS SINCE WEIGHT LOSS   Past Surgical History  Procedure Laterality Date  . Breast enhancement surgery  september 12,2011  . Breast enhancement surgery      corrections made in febuary and april 2012  . Laparoscopic gastric banding  2009   Family History  Problem Relation Age of Onset  . Heart disease Father 66    heart attack   . Cancer Father     melanoma  . Cancer Sister     lung   History  Substance Use Topics  . Smoking status: Never Smoker   . Smokeless tobacco: Never Used  . Alcohol Use: Yes   OB History   Grav Para Term Preterm Abortions TAB SAB Ect Mult Living                  Review of Systems  Constitutional: Negative for fever, diaphoresis, appetite change, fatigue and unexpected weight change.  HENT: Negative for mouth sores and neck stiffness.   Eyes: Negative for visual disturbance.  Respiratory: Negative for cough, chest tightness, shortness of breath and wheezing.   Cardiovascular: Negative for chest pain.  Gastrointestinal: Negative for nausea, vomiting, abdominal pain, diarrhea and constipation.  Endocrine: Negative for polydipsia, polyphagia and polyuria.  Genitourinary: Negative for dysuria, urgency, frequency and hematuria.  Musculoskeletal: Negative for back pain.  Skin: Negative for rash.  Allergic/Immunologic: Negative for immunocompromised state.  Neurological: Negative for syncope, light-headedness and headaches.  Hematological: Does not bruise/bleed easily.  Psychiatric/Behavioral: Negative for sleep disturbance. The patient is not nervous/anxious.     Allergies  Review of patient's allergies indicates no known allergies.  Home Medications   Current Outpatient Rx  Name  Route  Sig  Dispense  Refill  . ALPRAZolam (XANAX) 0.5 MG tablet   Oral   Take 0.5 mg by mouth 3 (three) times daily as needed. Anxiety         . hydrochlorothiazide (MICROZIDE) 12.5 MG capsule   Oral   Take 12.5 mg by mouth every morning.         Marland Kitchen ibuprofen (ADVIL,MOTRIN) 100 MG/5ML suspension   Oral   Take 250 mg by mouth every  6 (six) hours as needed. Pain         . Multiple Vitamins-Minerals (MULTIVITAMIN GUMMIES ADULT) CHEW   Oral   Chew 1 tablet by mouth daily.          BP 125/71  Pulse 78  Temp(Src) 98.2 F (36.8 C) (Oral)  Resp 18  SpO2 99% Physical Exam  Nursing note and vitals reviewed. Constitutional: She is oriented to person, place, and time. She appears well-developed and well-nourished. No distress.  Awake, alert, nontoxic appearance  HENT:  Head: Normocephalic and atraumatic.  Mouth/Throat: Oropharynx is clear and moist. No  oropharyngeal exudate.  Eyes: Conjunctivae are normal. Pupils are equal, round, and reactive to light. No scleral icterus.  Neck: Normal range of motion. Neck supple.  Cardiovascular: Normal rate, regular rhythm, S1 normal, S2 normal and intact distal pulses.   Murmur heard. Pulses:      Radial pulses are 2+ on the right side, and 2+ on the left side.       Dorsalis pedis pulses are 2+ on the right side, and 2+ on the left side.  Capillary refill less than 3  Pulmonary/Chest: Effort normal and breath sounds normal. No respiratory distress. She has no wheezes. She has no rales.  Abdominal: Soft. Bowel sounds are normal. She exhibits no distension. There is no tenderness. There is no rebound.  Musculoskeletal: Normal range of motion. She exhibits no edema.  Lymphadenopathy:    She has no cervical adenopathy.  Neurological: She is alert and oriented to person, place, and time. She exhibits normal muscle tone. Coordination normal.  Speech is clear and goal oriented Moves extremities without ataxia  Skin: Skin is warm and dry. No rash noted. She is not diaphoretic. No erythema.  Psychiatric: She has a normal mood and affect.    ED Course  Procedures (including critical care time) Labs Review Labs Reviewed  CBC WITH DIFFERENTIAL - Abnormal; Notable for the following:    Hemoglobin 10.1 (*)    HCT 31.5 (*)    MCV 67.6 (*)    MCH 21.7 (*)    RDW 16.3 (*)    All other components within normal limits  COMPREHENSIVE METABOLIC PANEL - Abnormal; Notable for the following:    Potassium <2.0 (*)    Chloride 93 (*)    CO2 36 (*)    Glucose, Bld 121 (*)    BUN 5 (*)    Albumin 3.2 (*)    All other components within normal limits  MAGNESIUM   ECG:  Date: 01/10/2013  Rate: 70  Rhythm: normal sinus rhythm  QRS Axis: normal  Intervals: QT prolonged  ST/T Wave abnormalities: nonspecific T wave changes  Conduction Disutrbances:none  Narrative Interpretation: prolonged QT from 03/08/12   Old EKG Reviewed: changes noted   Imaging Review No results found.  MDM   1. Hypokalemia    Lori Summers presents with hypokalemia. Potassium below 2 here in the department today.  We'll begin with oral and IV repletion and proceed with admission.  He is without complaint. EKG without evidence of U waves.  Patient alert, oriented, nontoxic, nonseptic appearing.  Will admit for potassium repletion.  Patient has been given 80 mEq potassium by mouth, potassium IV 10 mEq bolus x3 is ordered and we will give magnesium as well.  Dr. Manus Gunning was consulted, evaluated this patient with me and agrees with the plan.      Dierdre Forth, PA-C 01/10/13 1813  Lori Housley, PA-C 01/10/13 1820

## 2013-01-11 DIAGNOSIS — D649 Anemia, unspecified: Secondary | ICD-10-CM

## 2013-01-11 LAB — BASIC METABOLIC PANEL
BUN: 3 mg/dL — ABNORMAL LOW (ref 6–23)
Calcium: 10.1 mg/dL (ref 8.4–10.5)
Calcium: 8.9 mg/dL (ref 8.4–10.5)
Creatinine, Ser: 0.54 mg/dL (ref 0.50–1.10)
GFR calc Af Amer: >90 mL/min (ref >90)
GFR calc Af Amer: >90 mL/min (ref >90)
GFR calc non Af Amer: >90 mL/min (ref >90)
GFR calc non Af Amer: >90 mL/min (ref >90)
Potassium: 2 mEq/L — CL (ref 3.5–5.1)
Potassium: <2 mEq/L — CL (ref 3.5–5.1)
Sodium: 142 mEq/L (ref 135–145)

## 2013-01-11 LAB — CBC
Hemoglobin: 9.4 g/dL — ABNORMAL LOW (ref 12.0–15.0)
MCHC: 32.2 g/dL (ref 30.0–36.0)
Platelets: 256 10*3/uL (ref 150–400)
RDW: 16.3 % — ABNORMAL HIGH (ref 11.5–15.5)

## 2013-01-11 MED ORDER — POTASSIUM CHLORIDE 10 MEQ/100ML IV SOLN
10.0000 meq | INTRAVENOUS | Status: AC
  Administered 2013-01-11 (×6): 10 meq via INTRAVENOUS
  Filled 2013-01-11 (×6): qty 100

## 2013-01-11 MED ORDER — POTASSIUM CHLORIDE 20 MEQ/15ML (10%) PO LIQD
40.0000 meq | ORAL | Status: AC
  Administered 2013-01-11 (×2): 40 meq via ORAL
  Filled 2013-01-11 (×4): qty 30

## 2013-01-11 MED ORDER — KETOROLAC TROMETHAMINE 30 MG/ML IJ SOLN
30.0000 mg | Freq: Once | INTRAMUSCULAR | Status: AC
  Administered 2013-01-11: 30 mg via INTRAVENOUS
  Filled 2013-01-11: qty 1

## 2013-01-11 MED ORDER — METOCLOPRAMIDE HCL 5 MG/ML IJ SOLN
5.0000 mg | Freq: Four times a day (QID) | INTRAMUSCULAR | Status: DC
Start: 2013-01-11 — End: 2013-01-13
  Administered 2013-01-11 – 2013-01-13 (×7): 5 mg via INTRAVENOUS
  Filled 2013-01-11 (×12): qty 1

## 2013-01-11 MED ORDER — ZOLPIDEM TARTRATE 5 MG PO TABS
5.0000 mg | ORAL_TABLET | Freq: Once | ORAL | Status: AC
  Administered 2013-01-11: 5 mg via ORAL
  Filled 2013-01-11: qty 1

## 2013-01-11 MED ORDER — PROMETHAZINE HCL 25 MG/ML IJ SOLN
12.5000 mg | Freq: Four times a day (QID) | INTRAMUSCULAR | Status: DC | PRN
  Administered 2013-01-11 (×2): 12.5 mg via INTRAVENOUS
  Filled 2013-01-11 (×4): qty 1

## 2013-01-11 NOTE — Progress Notes (Signed)
[facility] PROGRESS NOTE  Randall Rampersad AVW:[id number] DOB: 1961/02/12 DOA: 01/10/2013 PCP: Lillia Carmel, MD  Assessment/Plan: Active Problems:   Hypokalemia    1. Hypokalemia: Patient presented with a potassium of 2.0, in the setting of recent vomiting, and intermittent HCTZ use. Magnesium was normal at 1.8. Repleting as indicated. Magnesium was also administered by admitting MD. 2. Morbid obesity: S/p Lap band 2009, followed by revision 02/1012. Following with Dr Johna Sheriff, who has been adjusting fill, dependent on symptoms. Patient is no longer obese. Will manage with iv Loperamide/prn iv Phenergan for vomiting, but if this persists, will consult Dr Johna Sheriff et al. 3. DM-2: Now diet-controlled, since gastric banding/weight loss.  4. HTN: Controlled.   Code Status: Full Code Family Communication:  Disposition Plan: To be determined.    Brief narrative: 52 y.o. female with history of HTN, DM-2, s/p Augumentation Mammoplasty 04/2010, s/p gastric banding 2009, followed by revision 02/2012, for slipping. Since then, has had filling of band, and on occasion, removal of fluid, by Dr Leeanne Mannan al, dependent on symptoms. From 01/02/13-01/04/13, patient had recurrent nausea and vomiting, was seen by Dr Johna Sheriff on 01/05/13, had 3/4 cc of fluid taken out from band, with resolution of symptoms. On 01/09/13, patient was sen buy PCP for routine check up, had blood work done, which subsequently confirmed profound hypokalemia. She denies fever, chills, headache, chest pain, shortness of breath abdominal pain, diarrhea, weakness, dizziness, syncope, dysuria, hematuria. Patient's potassium was 2.1 on 01/09/13, when checked by the general surgeon and again 2.0 when checked at her PCP's office. In the ED, patient was completely asymptomatic. She was administered 80 meq K-Dur PO and 30 meg KCL IV. Admitted for further management.   Consultants:  N/A.   Procedures:  N/A,.   Antibiotics:  N/A.    HPI/Subjective: Had 2 episodes of vomiting this AM. No abdominal pain.  Objective: Vital signs in last 24 hours: Temp:  [97.6 F (36.4 C)-98.7 F (37.1 C)] 98.7 F (37.1 C) (08/27 0451) Pulse Rate:  [66-78] 66 (08/27 0451) Resp:  [18] 18 (08/26 1828) BP: (121-156)/(70-86) 125/70 mmHg (08/27 0451) SpO2:  [98 %-100 %] 98 % (08/27 0451) Weight:  [64.955 kg (143 lb 3.2 oz)] 64.955 kg (143 lb 3.2 oz) (08/27 0451) Weight change:     Intake/Output from previous day:       Physical Exam: General: Comfortable, alert, communicative, fully oriented, not short of breath at rest.  HEENT:  Mild clinical pallor, no jaundice, no conjunctival injection or discharge. NECK:  Supple, JVP not seen, no carotid bruits, no palpable lymphadenopathy, no palpable goiter. CHEST:  Clinically clear to auscultation, no wheezes, no crackles. HEART:  Sounds 1 and 2 heard, normal, regular, no murmurs. ABDOMEN:  Full, soft, non-tender, no palpable organomegaly, no palpable masses, normal bowel sounds. GENITALIA:  Not examined. LOWER EXTREMITIES:  No pitting edema, palpable peripheral pulses. MUSCULOSKELETAL SYSTEM:  Unremarkable. CENTRAL NERVOUS SYSTEM:  No focal neurologic deficit on gross examination.  Lab Results:  Recent Labs  01/10/13 1614 01/11/13 0430  WBC 5.3 5.8  HGB 10.1* 9.4*  HCT 31.5* 29.2*  PLT 296 256    Recent Labs  01/10/13 2254 01/11/13 0430  NA 142 141  K 2.0* <2.0*  CL 94* 95*  CO2 38* 36*  GLUCOSE 120* 125*  BUN 4* 3*  CREATININE 0.56 0.54  CALCIUM 10.1 8.9   No results found for this or any previous visit (from the past 240 hour(s)).   Studies/Results: No results found.  Medications: Scheduled Meds: . potassium chloride  10 mEq Intravenous Q1 Hr x 6  . sodium chloride  3 mL Intravenous Q12H   Continuous Infusions:  PRN Meds:.    LOS: 1 day   Avi Archuleta,CHRISTOPHER  [facility] Pager [phone]. If 8PM-8AM, please contact night-coverage at  [email or web address], password [facility] 01/11/2013, 12:55 PM  LOS: 1 day

## 2013-01-11 NOTE — Progress Notes (Signed)
CRITICAL VALUE ALERT  Critical value received:  Potassium 1.8   Date of notification: 01/11/13  Time of notification: 0600  Critical value read back: yes  Nurse who received alert:  Mellody Dance  MD notified (1st page): Craige Cotta  Time of first page:  0605   MD notified (2nd page):Kirby  Time of second page: 0620  Responding MD: Craige Cotta  Time MD responded: 0700

## 2013-01-11 NOTE — ED Provider Notes (Signed)
Medical screening examination/treatment/procedure(s) were conducted as a shared visit with non-physician practitioner(s) and myself.  I personally evaluated the patient during the encounter  Hx gastric bypass, nausea and vomiting last week.  Found to be hypokalemic. Abdomen soft and nontender. Check magnesium.  Glynn Octave, MD 01/11/13 [phone]

## 2013-01-11 NOTE — Progress Notes (Signed)
CRITICAL VALUE ALERT  Critical value received:  Potassium 2.0  Date of notification:  01/11/13  Time of notification: 0017  Critical value read back:yes  Nurse who received alert:  Mellody Dance  MD notified (1st page):  Odessa Fleming  Time of first page:  0020  MD notified (2nd page):  Time of second page:  Responding MD:  Craige Cotta  Time MD responded:  [phone]

## 2013-01-11 NOTE — Progress Notes (Addendum)
Nutrition Brief Note  Patient identified on the Malnutrition Screening Tool (MST) Report. Pt reports weight loss 2/2 Lap Band. She notes poor appetite with n/v x 1 week prior to getting 3 mL removed from her lap band. She reports that prior to the onset of her n/v, and after her procedure, her oral intake was normal. Denies any questions/concerns at this time.  Wt Readings from Last 15 Encounters:  01/11/13 143 lb 3.2 oz (64.955 kg)  01/05/13 135 lb (61.236 kg)  10/07/12 160 lb 12.8 oz (72.938 kg)  09/08/12 170 lb 6.4 oz (77.293 kg)  08/04/12 176 lb 3.2 oz (79.924 kg)  07/07/12 171 lb 12.8 oz (77.928 kg)  06/10/12 167 lb 9.6 oz (76.023 kg)  05/19/12 162 lb (73.483 kg)  04/21/12 148 lb 12.8 oz (67.495 kg)  04/19/12 149 lb 8 oz (67.813 kg)  03/25/12 129 lb 12.8 oz (58.877 kg)  03/08/12 121 lb (54.885 kg)  03/08/12 121 lb (54.885 kg)  03/03/12 109 lb 12.8 oz (49.805 kg)  04/24/11 136 lb 9.6 oz (61.961 kg)    Body mass index is 23.83 kg/(m^2). Patient meets criteria for WNL based on current BMI.   Current diet order is Regular. Labs and medications reviewed.   No nutrition interventions warranted at this time. If nutrition issues arise, please consult RD.   Jarold Motto MS, RD, LDN Pager: [phone] After-hours pager: [phone]

## 2013-01-11 NOTE — Progress Notes (Signed)
Utilization review completed.  

## 2013-01-12 ENCOUNTER — Telehealth (INDEPENDENT_AMBULATORY_CARE_PROVIDER_SITE_OTHER): Payer: Self-pay | Admitting: *Deleted

## 2013-01-12 ENCOUNTER — Observation Stay (HOSPITAL_COMMUNITY): Payer: BC Managed Care – PPO

## 2013-01-12 DIAGNOSIS — E876 Hypokalemia: Secondary | ICD-10-CM

## 2013-01-12 DIAGNOSIS — R112 Nausea with vomiting, unspecified: Secondary | ICD-10-CM

## 2013-01-12 LAB — CBC
Hemoglobin: 9.4 g/dL — ABNORMAL LOW (ref 12.0–15.0)
Platelets: 251 10*3/uL (ref 150–400)
RBC: 4.3 MIL/uL (ref 3.87–5.11)
WBC: 5.8 10*3/uL (ref 4.0–10.5)

## 2013-01-12 LAB — POTASSIUM
Potassium: <2 mEq/L — CL (ref 3.5–5.1)
Potassium: 2.8 mEq/L — ABNORMAL LOW (ref 3.5–5.1)

## 2013-01-12 LAB — BASIC METABOLIC PANEL
CO2: 34 mEq/L — ABNORMAL HIGH (ref 19–32)
GFR calc non Af Amer: >90 mL/min (ref >90)
Glucose, Bld: 110 mg/dL — ABNORMAL HIGH (ref 70–99)
Potassium: 2.1 mEq/L — CL (ref 3.5–5.1)
Sodium: 140 mEq/L (ref 135–145)

## 2013-01-12 LAB — MAGNESIUM: Magnesium: 1.9 mg/dL (ref 1.5–2.5)

## 2013-01-12 MED ORDER — PANTOPRAZOLE SODIUM 40 MG IV SOLR
40.0000 mg | INTRAVENOUS | Status: DC
  Administered 2013-01-12: 40 mg via INTRAVENOUS
  Filled 2013-01-12 (×2): qty 40

## 2013-01-12 MED ORDER — POTASSIUM CHLORIDE 20 MEQ/15ML (10%) PO LIQD
40.0000 meq | Freq: Three times a day (TID) | ORAL | Status: DC
  Administered 2013-01-13: 40 meq via ORAL
  Filled 2013-01-12 (×3): qty 30

## 2013-01-12 MED ORDER — POTASSIUM CHLORIDE 20 MEQ/15ML (10%) PO LIQD
40.0000 meq | ORAL | Status: AC
  Administered 2013-01-12 (×2): 40 meq via ORAL
  Filled 2013-01-12 (×2): qty 30

## 2013-01-12 MED ORDER — SODIUM CHLORIDE 0.9 % IV SOLN
INTRAVENOUS | Status: DC
  Administered 2013-01-12: 20:00:00 via INTRAVENOUS

## 2013-01-12 MED ORDER — SODIUM CHLORIDE 0.9 % IJ SOLN
10.0000 mL | INTRAMUSCULAR | Status: DC | PRN
  Administered 2013-01-13: 10 mL

## 2013-01-12 MED ORDER — KETOROLAC TROMETHAMINE 30 MG/ML IJ SOLN
30.0000 mg | Freq: Once | INTRAMUSCULAR | Status: AC
  Administered 2013-01-12: 30 mg via INTRAVENOUS
  Filled 2013-01-12 (×2): qty 1

## 2013-01-12 MED ORDER — POTASSIUM CHLORIDE 10 MEQ/100ML IV SOLN
10.0000 meq | INTRAVENOUS | Status: DC
  Filled 2013-01-12 (×6): qty 100

## 2013-01-12 MED ORDER — POTASSIUM CHLORIDE 10 MEQ/100ML IV SOLN
10.0000 meq | INTRAVENOUS | Status: AC
  Administered 2013-01-12 – 2013-01-13 (×4): 10 meq via INTRAVENOUS
  Filled 2013-01-12 (×4): qty 100

## 2013-01-12 MED ORDER — POTASSIUM CHLORIDE 20 MEQ/15ML (10%) PO LIQD
60.0000 meq | Freq: Four times a day (QID) | ORAL | Status: DC
  Administered 2013-01-12 (×2): 60 meq via ORAL
  Filled 2013-01-12 (×4): qty 45

## 2013-01-12 MED ORDER — ALPRAZOLAM 0.5 MG PO TABS
0.5000 mg | ORAL_TABLET | Freq: Three times a day (TID) | ORAL | Status: DC | PRN
  Administered 2013-01-12 (×2): 0.5 mg via ORAL
  Filled 2013-01-12 (×2): qty 1

## 2013-01-12 NOTE — Consult Note (Signed)
Reason for Consult:nause and emesis s/p lap band Referring Physician: Dr. Leo Rod is an 52 y.o. female.  HPI: Pt is Lori 52 y/o F pt of Dr. Johna Sheriff that has undergone Lori previous lap band placement and revision for slippage (10/13).  Pt with Lori h/o 1-2 weeks of reflux and on and off nausea and emesis.  Pt admitted secondary to hypokalemia, and has been undergoing replacement.  Pt notified the office and Dr. Johna Sheriff of her admission.  Barium esophagogram was obtained which shows Lori slipped band.  She con't with nausea and emesis and has not been to keep any ice chips or sips of water down.  Past Medical History  Diagnosis Date  . Nausea and vomiting     RESOLVED ON 03/03/12 AFTER DR REMOVED FLUID FROM GASTRIC BAND  . Weight loss   . Hypertension     NO PROBLEMS WITH HYPERTENSION SINCE WEIGHT LOSS  . High cholesterol     "before lap band" (01/10/2013)  . Diabetes mellitus     "before lap band" (01/10/2013)  . Anemia   . GERD (gastroesophageal reflux disease)   . Gout   . Anxiety     Past Surgical History  Procedure Laterality Date  . Laparoscopic gastric banding  03/09/2008  . Tonsillectomy    . Cesarean section  1980's  . Augmentation mammaplasty Bilateral 01/27/2010  . Augmentation mammaplasty Left 06/2010; 08/2010  . Gastric banding port revision  03/08/2012    Family History  Problem Relation Age of Onset  . Heart disease Father 69    heart attack   . Cancer Father     melanoma  . Cancer Sister     lung    Social History:  reports that she has never smoked. She has never used smokeless tobacco. She reports that  drinks alcohol. She reports that she does not use illicit drugs.  Allergies: No Known Allergies  Medications: I have reviewed the patient's current medications.  Results for orders placed during the hospital encounter of 01/10/13 (from the past 48 hour(s))  BASIC METABOLIC PANEL     Status: Abnormal   Collection Time    01/10/13 10:54 PM      Result  Value Range   Sodium 142  135 - 145 mEq/L   Potassium 2.0 (*) 3.5 - 5.1 mEq/L   Comment: CRITICAL RESULT CALLED TO, READ BACK BY AND VERIFIED WITH:     AAida Puffer Summers [phone] GREEN R   Chloride 94 (*) 96 - 112 mEq/L   CO2 38 (*) 19 - 32 mEq/L   Glucose, Bld 120 (*) 70 - 99 mg/dL   BUN 4 (*) 6 - 23 mg/dL   Creatinine, Ser 9.14  0.50 - 1.10 mg/dL   Calcium 78.2  8.4 - 95.6 mg/dL   GFR calc non Af Amer >90  >90 mL/min   GFR calc Af Amer >90  >90 mL/min   Comment: (NOTE)     The eGFR has been calculated using the CKD EPI equation.     This calculation has not been validated in all clinical situations.     eGFR's persistently <90 mL/min signify possible Chronic Kidney     Disease.  BASIC METABOLIC PANEL     Status: Abnormal   Collection Time    01/11/13  4:30 AM      Result Value Range   Sodium 141  135 - 145 mEq/L   Potassium <2.0 (*) 3.5 - 5.1 mEq/L  Comment: CRITICAL RESULT CALLED TO, READ BACK BY AND VERIFIED WITH:     Lori Summers [phone] GREEN R   Chloride 95 (*) 96 - 112 mEq/L   CO2 36 (*) 19 - 32 mEq/L   Glucose, Bld 125 (*) 70 - 99 mg/dL   BUN 3 (*) 6 - 23 mg/dL   Creatinine, Ser 8.29  0.50 - 1.10 mg/dL   Calcium 8.9  8.4 - 56.2 mg/dL   GFR calc non Af Amer >90  >90 mL/min   GFR calc Af Amer >90  >90 mL/min   Comment: (NOTE)     The eGFR has been calculated using the CKD EPI equation.     This calculation has not been validated in all clinical situations.     eGFR's persistently <90 mL/min signify possible Chronic Kidney     Disease.  CBC     Status: Abnormal   Collection Time    01/11/13  4:30 AM      Result Value Range   WBC 5.8  4.0 - 10.5 Lori/uL   RBC 4.33  3.87 - 5.11 MIL/uL   Hemoglobin 9.4 (*) 12.0 - 15.0 g/dL   HCT 13.0 (*) 86.5 - 78.4 %   MCV 67.4 (*) 78.0 - 100.0 fL   MCH 21.7 (*) 26.0 - 34.0 pg   MCHC 32.2  30.0 - 36.0 g/dL   RDW 69.6 (*) 29.5 - 28.4 %   Platelets 256  150 - 400 Lori/uL  POTASSIUM     Status: Abnormal   Collection Time     01/11/13 11:15 PM      Result Value Range   Potassium <2.0 (*) 3.5 - 5.1 mEq/L   Comment: CRITICAL RESULT CALLED TO, READ BACK BY AND VERIFIED WITH:     Lori Summers,Lori Summers 01/12/2013 0011 JORDANS  MAGNESIUM     Status: None   Collection Time    01/11/13 11:15 PM      Result Value Range   Magnesium 1.9  1.5 - 2.5 mg/dL  CBC     Status: Abnormal   Collection Time    01/12/13  5:50 AM      Result Value Range   WBC 5.8  4.0 - 10.5 Lori/uL   RBC 4.30  3.87 - 5.11 MIL/uL   Hemoglobin 9.4 (*) 12.0 - 15.0 g/dL   HCT 13.2 (*) 44.0 - 10.2 %   MCV 67.9 (*) 78.0 - 100.0 fL   MCH 21.9 (*) 26.0 - 34.0 pg   MCHC 32.2  30.0 - 36.0 g/dL   RDW 72.5 (*) 36.6 - 44.0 %   Platelets 251  150 - 400 Lori/uL  BASIC METABOLIC PANEL     Status: Abnormal   Collection Time    01/12/13  5:50 AM      Result Value Range   Sodium 140  135 - 145 mEq/L   Potassium 2.1 (*) 3.5 - 5.1 mEq/L   Comment: CRITICAL RESULT CALLED TO, READ BACK BY AND VERIFIED WITH:     Lori DUNDON,Summers AT 0747 01/12/13 BY Lori Summers   Chloride 96  96 - 112 mEq/L   CO2 34 (*) 19 - 32 mEq/L   Glucose, Bld 110 (*) 70 - 99 mg/dL   BUN <3 (*) 6 - 23 mg/dL   Comment: REPEATED TO VERIFY   Creatinine, Ser 0.51  0.50 - 1.10 mg/dL   Calcium 8.4  8.4 - 34.7 mg/dL   GFR calc non Af Amer >90  >  90 mL/min   GFR calc Af Amer >90  >90 mL/min   Comment: (NOTE)     The eGFR has been calculated using the CKD EPI equation.     This calculation has not been validated in all clinical situations.     eGFR's persistently <90 mL/min signify possible Chronic Kidney     Disease.  POTASSIUM     Status: Abnormal   Collection Time    01/12/13 12:03 PM      Result Value Range   Potassium 2.8 (*) 3.5 - 5.1 mEq/L    Dg Esophagus  01/12/2013   CLINICAL DATA:  Gastric banding. Vomiting.  EXAM: ESOPHOGRAM/BARIUM SWALLOW  TECHNIQUE: Single contrast examination was performed using  thin barium.  COMPARISON:  07/23/2009  FLUOROSCOPY TIME:  3 min, 6 seconds  FINDINGS: The patient had  difficulty taking medication this morning. Lori modified protocol was utilized to account for the fact that the patient was uncomfortable with swallowing.  The esophagus appears markedly dilated and filled with material which causes Lori complex and indistinct appearance. This likely reflects chronic food material. Primary peristaltic waves were disrupted in the esophagus on all swallows.  At the gastroesophageal junction, contrast causes fluid-fluid levels at several levels proximal to the lap band rain. I suspect that these represent saccular dilatations of the stomach that of slipped to the ring. Some of this could be due to dilated esophagus although stomach is favored.  I turned the patient and made attempts to visualize contrast extending through the ring. Although contrast did extend through the ring, the lumen in this vicinity did not distend beyond Lori few mm. It was not early possible to cause maximal distention of the lumen in this vicinity by giving Lori large swallowing bolus given the capacity of the esophagus in the aperistaltic appearance of the esophagus.  There was some stasis of contrast in the upper cervical esophagus which may predispose to tracheal aspiration. The pharyngeal phase of swallowing was not assessed in detail.  IMPRESSION: 1. Markedly dilated distal esophagus with extensive irregular filling defect probably representing food material but technically nonspecific. Cannot exclude esophagitis, esophageal mass, or esophageal ulcer given the degree of irregularity and dilution of contrast that results during swallowing. 2. Several lobulations of the stomach appear to of slipped proximal to the lap band ring. The lumen diameter in the vicinity of the lap band was never shown to be more than several mm, although I was not able to cause significant distending pressure due to the aperistaltic esophagus, esophageal dilatation, and capacious proximal sacculations of the stomach.   Electronically Signed    By: Lori Summers   On: 01/12/2013 12:14    Review of Systems  Constitutional: Positive for weight loss (planned).  HENT: Negative.   Eyes: Negative.   Respiratory: Negative.   Cardiovascular: Negative.   Gastrointestinal: Positive for heartburn, nausea and vomiting. Negative for abdominal pain and diarrhea.  Genitourinary: Negative.   Musculoskeletal: Negative.   Skin: Negative.   Neurological: Negative.   All other systems reviewed and are negative.   Blood pressure 146/76, pulse 74, temperature 98 F (36.7 C), temperature source Oral, resp. rate 18, weight 143 lb 3.2 oz (64.955 kg), last menstrual period 01/07/2013, SpO2 100.00%. Physical Exam  Constitutional: She is oriented to person, place, and time. She appears well-developed and well-nourished.  HENT:  Head: Normocephalic and atraumatic.  Eyes: Conjunctivae and EOM are normal. Pupils are equal, round, and reactive to light.  Neck: Normal range of  motion. Neck supple.  Cardiovascular: Normal rate, regular rhythm and normal heart sounds.   Respiratory: Effort normal and breath sounds normal.  GI: Soft. Bowel sounds are normal. She exhibits no distension. There is no tenderness. There is no rebound and no guarding.  Musculoskeletal: Normal range of motion.  Neurological: She is alert and oriented to person, place, and time.  Skin: Skin is warm and dry.    Assessment/Plan: 52 y/o F s/p lap band placement and revision with Lori slipped lap band per Rads report 1. Agree with con't NPO at this time.  2.  Will remove the fluid in her band at this time to allow her to take some PO 3. Will D/w Dr. Johna Sheriff to allow any further recommendations.  Lori Summers., Lori Summers 01/12/2013, 5:22 PM

## 2013-01-12 NOTE — Progress Notes (Signed)
CRITICAL VALUE ALERT  Critical value received:  Potassium   Date of notification:  01/12/13/  Time of notification:0011  Critical value read back:yes  Nurse who received alert:  Revonda Standard  MD notified (1st page):  Craige Cotta  Time of first page: 0015  MD notified (2nd page):  Time of second page  Responding MD:  Craige Cotta  Time MD responded:  [phone]

## 2013-01-12 NOTE — Significant Event (Signed)
CRITICAL VALUE ALERT  Critical value received:  potassium  Date of notification: 01/12/14  Time of notification:  0747  Critical value read back:yes  Nurse who received alert:  Revonda Standard   MD notified (1st page):  oti  Time of first page: 0755  MD notified (2nd page):  Time of second page:  Responding MD:  oti  Time MD responded:  0800

## 2013-01-12 NOTE — Progress Notes (Signed)
[facility] PROGRESS NOTE  Lori Summers ZOX:[id number] DOB: 11-Jun-1960 DOA: 01/10/2013 PCP: Lillia Carmel, MD  Assessment/Plan: Active Problems:   Hypokalemia    1. Hypokalemia: Patient presented with a potassium of 2.0, in the setting of recent vomiting, and intermittent HCTZ use. Magnesium was normal at 1.8. Repleting as indicated. Magnesium was also administered by admitting MD. 2. Morbid obesity: S/p Lap band 2009, followed by revision 02/1012. Following with Dr Johna Sheriff, who has been adjusting fill, dependent on symptoms. Patient is no longer obese. Managing with iv Loperamide/prn iv Phenergan for vomiting, with satisfactory clinical response. Have consulted Dr Glenna Fellows this AM, and he recommended barium swallow examination. He will review results, and offer recommendations, if indicated. Tolerated clears today. Diet advanced accordingly.  3. DM-2: Now diet-controlled, since gastric banding/weight loss.  4. HTN: Controlled.   Code Status: Full Code Family Communication:  Disposition Plan: To be determined.    Brief narrative: 52 y.o. female with history of HTN, DM-2, s/p Augumentation Mammoplasty 04/2010, s/p gastric banding 2009, followed by revision 02/2012, for slipping. Since then, has had filling of band, and on occasion, removal of fluid, by Dr Leeanne Mannan al, dependent on symptoms. From 01/02/13-01/04/13, patient had recurrent nausea and vomiting, was seen by Dr Johna Sheriff on 01/05/13, had 3/4 cc of fluid taken out from band, with resolution of symptoms. On 01/09/13, patient was sen buy PCP for routine check up, had blood work done, which subsequently confirmed profound hypokalemia. She denies fever, chills, headache, chest pain, shortness of breath abdominal pain, diarrhea, weakness, dizziness, syncope, dysuria, hematuria. Patient's potassium was 2.1 on 01/09/13, when checked by the general surgeon and again 2.0 when checked at her PCP's office. In the ED, patient was  completely asymptomatic. She was administered 80 meq K-Dur PO and 30 meg KCL IV. Admitted for further management.   Consultants:  Dr Glenna Fellows, surgeon.   Procedures:  Barium Swallow examination.   Antibiotics:  N/A.   HPI/Subjective: No more vomiting. Tolerating clears.   Objective: Vital signs in last 24 hours: Temp:  [97.2 F (36.2 C)-99.8 F (37.7 C)] 97.2 F (36.2 C) (08/28 0500) Pulse Rate:  [50-114] 50 (08/28 0500) BP: (113-136)/(61-81) 136/81 mmHg (08/28 0500) SpO2:  [94 %-100 %] 94 % (08/28 0500) Weight change:  Last BM Date: 01/11/13  Intake/Output from previous day: 08/27 0701 - 08/28 0700 In: 1530 [P.O.:380; I.V.:550; IV Piggyback:600] Out: -      Physical Exam: General: Comfortable, alert, communicative, fully oriented, not short of breath at rest.  HEENT:  Mild clinical pallor, no jaundice, no conjunctival injection or discharge. NECK:  Supple, JVP not seen, no carotid bruits, no palpable lymphadenopathy, no palpable goiter. CHEST:  Clinically clear to auscultation, no wheezes, no crackles. HEART:  Sounds 1 and 2 heard, normal, regular, no murmurs. ABDOMEN:  Full, soft, non-tender, no palpable organomegaly, no palpable masses, normal bowel sounds. GENITALIA:  Not examined. LOWER EXTREMITIES:  No pitting edema, palpable peripheral pulses. MUSCULOSKELETAL SYSTEM:  Unremarkable. CENTRAL NERVOUS SYSTEM:  No focal neurologic deficit on gross examination.  Lab Results:  Recent Labs  01/11/13 0430 01/12/13 0550  WBC 5.8 5.8  HGB 9.4* 9.4*  HCT 29.2* 29.2*  PLT 256 251    Recent Labs  01/11/13 0430 01/11/13 2315 01/12/13 0550  NA 141  --  140  K <2.0* <2.0* 2.1*  CL 95*  --  96  CO2 36*  --  34*  GLUCOSE 125*  --  110*  BUN 3*  --  <  3*  CREATININE 0.54  --  0.51  CALCIUM 8.9  --  8.4   No results found for this or any previous visit (from the past 240 hour(s)).   Studies/Results: No results found.  Medications: Scheduled  Meds: . metoCLOPramide (REGLAN) injection  5 mg Intravenous Q6H  . sodium chloride  3 mL Intravenous Q12H   Continuous Infusions:  PRN Meds:.promethazine    LOS: 2 days   Bentlee Drier,CHRISTOPHER  [facility] Pager [phone]. If 8PM-8AM, please contact night-coverage at [email or web address], password [facility] 01/12/2013, 9:04 AM  LOS: 2 days

## 2013-01-12 NOTE — Progress Notes (Signed)
Comment:  Formal report of barium swallow seen:  1. Markedly dilated distal esophagus with extensive irregular filling defect probably representing food material but technically nonspecific. Cannot exclude esophagitis, esophageal mass, or esophageal ulcer given the degree of irregularity and dilution of contrast that results during swallowing. 2. Several lobulations of the stomach appear to of slipped proximal to the lap band ring. The lumen diameter in the vicinity of the lap band was never shown to be more than several mm, although I was not able to cause significant distending pressure due to the aperistaltic esophagus, esophageal dilatation, and capacious proximal sacculations of the stomach.  A/P 1. Have informed Dr Leeanne Mannan al, of barium swallow exam findings. 2. Will make patient NPO, till evaluated by surgical team. 3. Continue Reglan. 4. IV fluids.  5. PPI.   C. Nahomi Hegner. MD, FACP.

## 2013-01-12 NOTE — Progress Notes (Signed)
Peripherally Inserted Central Catheter/Midline Placement  The IV Nurse has discussed with the patient and/or persons authorized to consent for the patient, the purpose of this procedure and the potential benefits and risks involved with this procedure.  The benefits include less needle sticks, lab draws from the catheter and patient may be discharged home with the catheter.  Risks include, but not limited to, infection, bleeding, blood clot (thrombus formation), and puncture of an artery; nerve damage and irregular heat beat.  Alternatives to this procedure were also discussed.  PICC/Midline Placement Documentation        Mellissa Kohut 01/12/2013, 6:56 PM

## 2013-01-12 NOTE — Progress Notes (Signed)
Came to see patient at patient request.  Patient concerned about her status, was ready to leave AMA, but discussed importance of continuing to replace potassium per potassium is rising but not at normal levels.  Informed patient that Dr. Johna Sheriff is aware of hospitalization.  Patient requested that I speak with Dr. Brien Few per he seemed to have an unclear understanding of bariatric surgery.  Paged Dr.Oti, waiting for response.  Will continue to monitor.

## 2013-01-12 NOTE — Telephone Encounter (Signed)
Patient called from [facility] hospital at which patient states she is an inpatient.  Patient reports that she had a radiology test done that showed her lap band has slipped again so she wanted to make sure Dr. Johna Sheriff was aware.  Explained to patient that a message will be sent to Dr. Johna Sheriff to ask for him to review her radiology studies.  Patient states she is in 3W10 at this time.

## 2013-01-13 LAB — BASIC METABOLIC PANEL
BUN: <3 mg/dL — ABNORMAL LOW (ref 6–23)
CO2: 32 mEq/L (ref 19–32)
Chloride: 100 mEq/L (ref 96–112)
Creatinine, Ser: 0.47 mg/dL — ABNORMAL LOW (ref 0.50–1.10)
GFR calc Af Amer: >90 mL/min (ref >90)
Potassium: 2.6 mEq/L — CL (ref 3.5–5.1)

## 2013-01-13 MED ORDER — POTASSIUM CHLORIDE CRYS ER 20 MEQ PO TBCR
40.0000 meq | EXTENDED_RELEASE_TABLET | Freq: Once | ORAL | Status: AC
  Administered 2013-01-13: 40 meq via ORAL
  Filled 2013-01-13: qty 2

## 2013-01-13 MED ORDER — PANTOPRAZOLE SODIUM 40 MG PO TBEC: 40.0000 mg | DELAYED_RELEASE_TABLET | Freq: Every day | ORAL | Status: DC

## 2013-01-13 MED ORDER — MAGNESIUM SULFATE 40 MG/ML IJ SOLN
2.0000 g | Freq: Once | INTRAMUSCULAR | Status: AC
  Administered 2013-01-13: 2 g via INTRAVENOUS
  Filled 2013-01-13: qty 50

## 2013-01-13 MED ORDER — POTASSIUM CHLORIDE 20 MEQ/15ML (10%) PO LIQD: 40.0000 meq | Freq: Every day | ORAL | Status: DC

## 2013-01-13 MED ORDER — POTASSIUM CHLORIDE 10 MEQ/100ML IV SOLN
10.0000 meq | INTRAVENOUS | Status: AC
  Administered 2013-01-13 (×4): 10 meq via INTRAVENOUS
  Filled 2013-01-13 (×4): qty 100

## 2013-01-13 NOTE — Progress Notes (Signed)
CRITICAL VALUE ALERT  Critical value received:  Potassium 2.6  Date of notification:  01/13/13  Time of notification:  0622  Critical value read back:yes  Nurse who received alert:  A.Christell Constant RN  MD notified (1st page):  Louie Bun NP  Time of first page:  0700  MD notified (2nd page):  Time of second page:  Responding MD:  Louie Bun NP  Time MD responded:  0700

## 2013-01-13 NOTE — Progress Notes (Signed)
Patient ID: Lori Summers, female   DOB: 1961/04/15, 52 y.o.   MRN: [id number]    Subjective: Patient feels well since fluid removed from her band yesterday. She is tolerating a liquid diet with no nausea or regurgitation or heartburn. Able to swallow pills without difficulty. No double pain.  Objective: Vital signs in last 24 hours: Temp:  [98.2 F (36.8 C)-98.4 F (36.9 C)] 98.2 F (36.8 C) (08/29 1414) Pulse Rate:  [65-76] 66 (08/29 1414) Resp:  [18] 18 (08/29 1414) BP: (110-119)/(66-71) 110/69 mmHg (08/29 1414) SpO2:  [94 %-100 %] 94 % (08/29 1414) Last BM Date: 01/11/13  Intake/Output from previous day: 08/28 0701 - 08/29 0700 In: 301.3 [I.V.:301.3] Out: -  Intake/Output this shift:    General appearance: alert, cooperative and no distress GI: normal findings: soft, non-tender  Lab Results:   Recent Labs  01/11/13 0430 01/12/13 0550  WBC 5.8 5.8  HGB 9.4* 9.4*  HCT 29.2* 29.2*  PLT 256 251   BMET  Recent Labs  01/12/13 0550  01/13/13 0500 01/13/13 1520  NA 140  --  140  --   K 2.1*  < > 2.6* 3.8  CL 96  --  100  --   CO2 34*  --  32  --   GLUCOSE 110*  --  96  --   BUN <3*  --  <3*  --   CREATININE 0.51  --  0.47*  --   CALCIUM 8.4  --  8.1*  --   < > = values in this interval not displayed.   Studies/Results: Dg Esophagus  01/12/2013   CLINICAL DATA:  Gastric banding. Vomiting.  EXAM: ESOPHOGRAM/BARIUM SWALLOW  TECHNIQUE: Single contrast examination was performed using  thin barium.  COMPARISON:  07/23/2009  FLUOROSCOPY TIME:  3 min, 6 seconds  FINDINGS: The patient had difficulty taking medication this morning. A modified protocol was utilized to account for the fact that the patient was uncomfortable with swallowing.  The esophagus appears markedly dilated and filled with material which causes a complex and indistinct appearance. This likely reflects chronic food material. Primary peristaltic waves were disrupted in the esophagus on all swallows.  At the  gastroesophageal junction, contrast causes fluid-fluid levels at several levels proximal to the lap band rain. I suspect that these represent saccular dilatations of the stomach that of slipped to the ring. Some of this could be due to dilated esophagus although stomach is favored.  I turned the patient and made attempts to visualize contrast extending through the ring. Although contrast did extend through the ring, the lumen in this vicinity did not distend beyond a few mm. It was not early possible to cause maximal distention of the lumen in this vicinity by giving a large swallowing bolus given the capacity of the esophagus in the aperistaltic appearance of the esophagus.  There was some stasis of contrast in the upper cervical esophagus which may predispose to tracheal aspiration. The pharyngeal phase of swallowing was not assessed in detail.  IMPRESSION: 1. Markedly dilated distal esophagus with extensive irregular filling defect probably representing food material but technically nonspecific. Cannot exclude esophagitis, esophageal mass, or esophageal ulcer given the degree of irregularity and dilution of contrast that results during swallowing. 2. Several lobulations of the stomach appear to of slipped proximal to the lap band ring. The lumen diameter in the vicinity of the lap band was never shown to be more than several mm, although I was not able to cause significant  distending pressure due to the aperistaltic esophagus, esophageal dilatation, and capacious proximal sacculations of the stomach.   Electronically Signed   By: Herbie Baltimore   On: 01/12/2013 12:14    Anti-infectives: Anti-infectives   None      Assessment/Plan: Severe hypokalemia. I don't think this is totally related to her lap band she actually has had minimal vomiting prior to admission.  The slipped lap band. I've reviewed for esophagram and I agree that this appears to be an anterior slip and the orientation of her band has  changed from her previous KUB after revision last year.  I accessed her port again today and I was able to withdraw an additional 1 cc of fluid.  She has no dysphagia or regurgitation or nausea at this point.  For now I would recommend observation on a liquid diet. It is possible the slip will correct itself with all the fluid removed from the band. She may require revision but as long as she tolerates a liquid diet with no difficulty there is no urgency to this. She could be discharged home on a liquid diet when medically stable. She has instructions to call me next week for followup.    LOS: 3 days    Ajanae Virag T 01/13/2013

## 2013-01-13 NOTE — Progress Notes (Signed)
[facility] PROGRESS NOTE  Lori Summers AVW:[id number] DOB: [birth year] DOA: 01/10/2013 PCP: Lori Carmel, MD  Assessment/Plan: Active Problems:   Hypokalemia    1. Hypokalemia: Patient presented with a potassium of 2.0, in the setting of recent vomiting, and intermittent HCTZ use. Magnesium was normal at 1.8. Repleting as indicated. Magnesium was administered by admitting MD. 2. Morbid obesity: S/p Lap band 2009, followed by revision 02/1012. Following with Dr Johna Sheriff, who has been adjusting fill, dependent on symptoms. Patient is no longer obese. Managed so far with iv Loperamide/prn iv Phenergan for vomiting, with satisfactory clinical response. Barium swallow examination, done at the instance of Dr Glenna Fellows on 01/12/13, revealed markedly dilated distal esophagus with extensive irregular filling defect probably representing food material but technically nonspecific, as well as several lobulations of the stomach appear to have slipped proximal to the lap band ring. The lumen diameter in the vicinity of the lap band was never shown to be more than several mm. Patient was made NPO, Dr Derrell Lolling provided surgical consultation, fluid was aspirated from lap band. Patient was subsequently seen by Dr Johna Sheriff, and as of 01/13/13, she was commenced on full liquid diet, which she is to stay on, till follow up with Dr Johna Sheriff after discharge. Reglan was discontinued on 01/13/13.  3. DM-2: Now diet-controlled, since gastric banding/weight loss.  4. HTN: Controlled.   Code Status: Full Code Family Communication:  Disposition Plan: To be determined.    Brief narrative: 52 y.o. female with history of HTN, DM-2, s/p Augumentation Mammoplasty 04/2010, s/p gastric banding 2009, followed by revision 02/2012, for slipping. Since then, has had filling of band, and on occasion, removal of fluid, by Dr Leeanne Mannan al, dependent on symptoms. From 01/02/13-01/04/13, patient had recurrent nausea and  vomiting, was seen by Dr Johna Sheriff on 01/05/13, had 3/4 cc of fluid taken out from band, with resolution of symptoms. On 01/09/13, patient was sen buy PCP for routine check up, had blood work done, which subsequently confirmed profound hypokalemia. She denies fever, chills, headache, chest pain, shortness of breath abdominal pain, diarrhea, weakness, dizziness, syncope, dysuria, hematuria. Patient's potassium was 2.1 on 01/09/13, when checked by the general surgeon and again 2.0 when checked at her PCP's office. In the ED, patient was completely asymptomatic. She was administered 80 meq K-Dur PO and 30 meg KCL IV. Admitted for further management.   Consultants:  Dr Glenna Fellows, surgeon.   Procedures:  Barium Swallow examination.   Antibiotics:  N/A.   HPI/Subjective: Asymptomatic.   Objective: Vital signs in last 24 hours: Temp:  [98 F (36.7 C)-98.4 F (36.9 C)] 98.4 F (36.9 C) (08/29 0531) Pulse Rate:  [65-76] 65 (08/29 0531) BP: (112-146)/(66-76) 112/66 mmHg (08/29 0531) SpO2:  [100 %] 100 % (08/29 0531) Weight change:  Last BM Date: 01/11/13  Intake/Output from previous day: 08/28 0701 - 08/29 0700 In: 301.3 [I.V.:301.3] Out: -      Physical Exam: General: Comfortable, alert, communicative, fully oriented, not short of breath at rest.  HEENT:  Mild clinical pallor, no jaundice, no conjunctival injection or discharge. NECK:  Supple, JVP not seen, no carotid bruits, no palpable lymphadenopathy, no palpable goiter. CHEST:  Clinically clear to auscultation, no wheezes, no crackles. HEART:  Sounds 1 and 2 heard, normal, regular, no murmurs. ABDOMEN:  Full, soft, non-tender, no palpable organomegaly, no palpable masses, normal bowel sounds. GENITALIA:  Not examined. LOWER EXTREMITIES:  No pitting edema, palpable peripheral pulses. MUSCULOSKELETAL SYSTEM:  Unremarkable. CENTRAL NERVOUS SYSTEM:  No focal neurologic deficit on gross examination.  Lab Results:  Recent  Labs  01/11/13 0430 01/12/13 0550  WBC 5.8 5.8  HGB 9.4* 9.4*  HCT 29.2* 29.2*  PLT 256 251    Recent Labs  01/12/13 0550  01/12/13 1712 01/13/13 0500  NA 140  --   --  140  K 2.1*  < > 2.8* 2.6*  CL 96  --   --  100  CO2 34*  --   --  32  GLUCOSE 110*  --   --  96  BUN <3*  --   --  <3*  CREATININE 0.51  --   --  0.47*  CALCIUM 8.4  --   --  8.1*  < > = values in this interval not displayed. No results found for this or any previous visit (from the past 240 hour(s)).   Studies/Results: Dg Esophagus  01/12/2013   CLINICAL DATA:  Gastric banding. Vomiting.  EXAM: ESOPHOGRAM/BARIUM SWALLOW  TECHNIQUE: Single contrast examination was performed using  thin barium.  COMPARISON:  07/23/2009  FLUOROSCOPY TIME:  3 min, 6 seconds  FINDINGS: The patient had difficulty taking medication this morning. A modified protocol was utilized to account for the fact that the patient was uncomfortable with swallowing.  The esophagus appears markedly dilated and filled with material which causes a complex and indistinct appearance. This likely reflects chronic food material. Primary peristaltic waves were disrupted in the esophagus on all swallows.  At the gastroesophageal junction, contrast causes fluid-fluid levels at several levels proximal to the lap band rain. I suspect that these represent saccular dilatations of the stomach that of slipped to the ring. Some of this could be due to dilated esophagus although stomach is favored.  I turned the patient and made attempts to visualize contrast extending through the ring. Although contrast did extend through the ring, the lumen in this vicinity did not distend beyond a few mm. It was not early possible to cause maximal distention of the lumen in this vicinity by giving a large swallowing bolus given the capacity of the esophagus in the aperistaltic appearance of the esophagus.  There was some stasis of contrast in the upper cervical esophagus which may  predispose to tracheal aspiration. The pharyngeal phase of swallowing was not assessed in detail.  IMPRESSION: 1. Markedly dilated distal esophagus with extensive irregular filling defect probably representing food material but technically nonspecific. Cannot exclude esophagitis, esophageal mass, or esophageal ulcer given the degree of irregularity and dilution of contrast that results during swallowing. 2. Several lobulations of the stomach appear to of slipped proximal to the lap band ring. The lumen diameter in the vicinity of the lap band was never shown to be more than several mm, although I was not able to cause significant distending pressure due to the aperistaltic esophagus, esophageal dilatation, and capacious proximal sacculations of the stomach.   Electronically Signed   By: Herbie Baltimore   On: 01/12/2013 12:14    Medications: Scheduled Meds: . metoCLOPramide (REGLAN) injection  5 mg Intravenous Q6H  . pantoprazole (PROTONIX) IV  40 mg Intravenous Q24H  . potassium chloride  10 mEq Intravenous Q1 Hr x 4  . potassium chloride  40 mEq Oral TID  . sodium chloride  3 mL Intravenous Q12H   Continuous Infusions: . sodium chloride 75 mL/hr at 01/12/13 1958   PRN Meds:.ALPRAZolam, promethazine, sodium chloride    LOS: 3 days   Donyell Ding,CHRISTOPHER  [facility] Pager [phone]. If  8PM-8AM, please contact night-coverage at [email or web address], password [email or web address] 01/13/2013, 12:27 PM  LOS: 3 days

## 2013-01-13 NOTE — Discharge Summary (Signed)
Physician Discharge Summary  Lori Summers WUJ:[id number] DOB: 10/17/1960 DOA: 01/10/2013  PCP: Lillia Carmel, MD  Admit date: 01/10/2013 Discharge date: 01/13/2013  Time spent: 40 minutes  Recommendations for Outpatient Follow-up:  1. Follow up with PMD.  2. Follow up with Dr Glenna Fellows. 3. PMD to check electrolytes next week.   Discharge Diagnoses:  Active Problems:   Hypokalemia   Discharge Condition: Satisfactory.  Diet recommendation: Full Liquid diet.   Filed Weights   01/11/13 0451  Weight: 64.955 kg (143 lb 3.2 oz)    History of present illness:  52 y.o. female with history of HTN, DM-2, s/p Augumentation Mammoplasty 04/2010, s/p gastric banding 2009, followed by revision 02/2012, for slipping. Since then, has had filling of band, and on occasion, removal of fluid, by Dr Leeanne Mannan al, dependent on symptoms. From 01/02/13-01/04/13, patient had recurrent nausea and vomiting, was seen by Dr Johna Sheriff on 01/05/13, had 3/4 cc of fluid taken out from band, with resolution of symptoms. On 01/09/13, patient was sen buy PCP for routine check up, had blood work done, which subsequently confirmed profound hypokalemia. She denies fever, chills, headache, chest pain, shortness of breath abdominal pain, diarrhea, weakness, dizziness, syncope, dysuria, hematuria. Patient's potassium was 2.1 on 01/09/13, when checked by the general surgeon and again 2.0 when checked at her PCP's office. In the ED, patient was completely asymptomatic. She was administered 80 meq K-Dur PO and 30 meg KCL IV. Admitted for further management.    Hospital Course:  1. Hypokalemia: Patient presented with a potassium of 2.0, in the setting of recent vomiting, and intermittent HCTZ use. Magnesium was normal at 1.8, but 2 gram of iv Magnesium was administered by admitting MD, the potassium was repleed as indicated, both iv and orally. Fortunately, by 8/29/4, potasium had normalized at 3.8. Patient was discharged on a  week course of KCL 40 meq daily, and is recommended to have her electrolytes checked next week, by PMD.  2. Morbid obesity: S/p Lap band 2009, followed by revision 02/1012. Following with Dr Johna Sheriff, who has been adjusting fill, dependent on symptoms. Patient is no longer obese. Managed so far with iv Loperamide/prn iv Phenergan for vomiting, with satisfactory clinical response. Barium swallow examination, done at the instance of Dr Glenna Fellows on 01/12/13, revealed markedly dilated distal esophagus with extensive irregular filling defect probably representing food material but technically nonspecific, as well as several lobulations of the stomach appear to have slipped proximal to the lap band ring. The lumen diameter in the vicinity of the lap band was never shown to be more than several mm. Patient was made NPO, Dr Derrell Lolling provided surgical consultation, fluid was aspirated from lap band. Patient was subsequently seen by Dr Johna Sheriff, and as of 01/13/13, she was commenced on full liquid diet, which she tolerated. Per Dr Johna Sheriff, she is to stay on this, till followed up, after discharge. Reglan was discontinued on 01/13/13, without deleterious effect.  3. DM-2: Now diet-controlled, since gastric banding/weight loss.  4. HTN: Controlled.    Procedures:  See Below.   Consultations:  Dr Axel Filler, surgeon.   Discharge Exam: Filed Vitals:   01/13/13 1414  BP: 110/69  Pulse: 66  Temp: 98.2 F (36.8 C)  Resp: 18    General: Comfortable, alert, communicative, fully oriented, not short of breath at rest.  HEENT: Mild clinical pallor, no jaundice, no conjunctival injection or discharge.  NECK: Supple, JVP not seen, no carotid bruits, no palpable lymphadenopathy, no palpable goiter.  CHEST: Clinically clear to auscultation, no wheezes, no crackles.  HEART: Sounds 1 and 2 heard, normal, regular, no murmurs.  ABDOMEN: Full, soft, non-tender, no palpable organomegaly, no palpable masses,  normal bowel sounds.  GENITALIA: Not examined.  LOWER EXTREMITIES: No pitting edema, palpable peripheral pulses.  MUSCULOSKELETAL SYSTEM: Unremarkable.  CENTRAL NERVOUS SYSTEM: No focal neurologic deficit on gross examination.      Discharge Instructions      Discharge Orders   Future Appointments Provider Department Dept Phone   01/19/2013 8:30 AM [facility], [place] [phone]   02/16/2013 8:30 AM [facility], [place] [phone]   Future Orders Complete By Expires   Increase activity slowly  As directed        Medication List    STOP taking these medications       hydrochlorothiazide 12.5 MG capsule  Commonly known as:  MICROZIDE     ibuprofen 100 MG/5ML suspension  Commonly known as:  ADVIL,MOTRIN      TAKE these medications       ALPRAZolam 0.5 MG tablet  Commonly known as:  XANAX  Take 0.5 mg by mouth 3 (three) times daily as needed. Anxiety     MULTIVITAMIN GUMMIES ADULT Chew  Chew 1 tablet by mouth daily.     pantoprazole 40 MG tablet  Commonly known as:  PROTONIX  Take 1 tablet (40 mg total) by mouth daily at 6 (six) AM.  Start taking on:  01/14/2013     potassium chloride 20 MEQ/15ML (10%) solution  Take 30 mLs (40 mEq total) by mouth daily.       No Known Allergies Follow-up Information   Schedule an appointment as soon as possible for a visit with Lillia Carmel, MD.   Specialty:  Family Medicine   Contact information:   [address] [place] [address] [phone]       Call Mariella Saa, MD.   Specialty:  General Surgery   Contact information:   [address] [place] [address] [phone]        The results of significant diagnostics from this hospitalization (including imaging, microbiology, ancillary and laboratory) are listed below for reference.    Significant Diagnostic Studies: Dg Esophagus  01/12/2013   CLINICAL DATA:   Gastric banding. Vomiting.  EXAM: ESOPHOGRAM/BARIUM SWALLOW  TECHNIQUE: Single contrast examination was performed using  thin barium.  COMPARISON:  07/23/2009  FLUOROSCOPY TIME:  3 min, 6 seconds  FINDINGS: The patient had difficulty taking medication this morning. A modified protocol was utilized to account for the fact that the patient was uncomfortable with swallowing.  The esophagus appears markedly dilated and filled with material which causes a complex and indistinct appearance. This likely reflects chronic food material. Primary peristaltic waves were disrupted in the esophagus on all swallows.  At the gastroesophageal junction, contrast causes fluid-fluid levels at several levels proximal to the lap band rain. I suspect that these represent saccular dilatations of the stomach that of slipped to the ring. Some of this could be due to dilated esophagus although stomach is favored.  I turned the patient and made attempts to visualize contrast extending through the ring. Although contrast did extend through the ring, the lumen in this vicinity did not distend beyond a few mm. It was not early possible to cause maximal distention of the lumen in this vicinity by giving a large swallowing bolus given the capacity of  the esophagus in the aperistaltic appearance of the esophagus.  There was some stasis of contrast in the upper cervical esophagus which may predispose to tracheal aspiration. The pharyngeal phase of swallowing was not assessed in detail.  IMPRESSION: 1. Markedly dilated distal esophagus with extensive irregular filling defect probably representing food material but technically nonspecific. Cannot exclude esophagitis, esophageal mass, or esophageal ulcer given the degree of irregularity and dilution of contrast that results during swallowing. 2. Several lobulations of the stomach appear to of slipped proximal to the lap band ring. The lumen diameter in the vicinity of the lap band was never shown to be  more than several mm, although I was not able to cause significant distending pressure due to the aperistaltic esophagus, esophageal dilatation, and capacious proximal sacculations of the stomach.   Electronically Signed   By: Herbie Baltimore   On: 01/12/2013 12:14    Microbiology: No results found for this or any previous visit (from the past 240 hour(s)).   Labs: Basic Metabolic Panel:  Recent Labs Lab 01/10/13 1614 01/10/13 2254 01/11/13 0430 01/11/13 2315 01/12/13 0550 01/12/13 1203 01/12/13 1712 01/13/13 0500 01/13/13 1520  NA 140 142 141  --  140  --   --  140  --   K <2.0* 2.0* <2.0* <2.0* 2.1* 2.8* 2.8* 2.6* 3.8  CL 93* 94* 95*  --  96  --   --  100  --   CO2 36* 38* 36*  --  34*  --   --  32  --   GLUCOSE 121* 120* 125*  --  110*  --   --  96  --   BUN 5* 4* 3*  --  <3*  --   --  <3*  --   CREATININE 0.61 0.56 0.54  --  0.51  --   --  0.47*  --   CALCIUM 9.9 10.1 8.9  --  8.4  --   --  8.1*  --   MG 1.8  --   --  1.9  --   --   --   --   --    Liver Function Tests:  Recent Labs Lab 01/10/13 1614  AST 27  ALT 19  ALKPHOS 64  BILITOT 0.3  PROT 7.2  ALBUMIN 3.2*   No results found for this basename: LIPASE, AMYLASE,  in the last 168 hours No results found for this basename: AMMONIA,  in the last 168 hours CBC:  Recent Labs Lab 01/10/13 1614 01/11/13 0430 01/12/13 0550  WBC 5.3 5.8 5.8  NEUTROABS 3.1  --   --   HGB 10.1* 9.4* 9.4*  HCT 31.5* 29.2* 29.2*  MCV 67.6* 67.4* 67.9*  PLT 296 256 251   Cardiac Enzymes: No results found for this basename: CKTOTAL, CKMB, CKMBINDEX, TROPONINI,  in the last 168 hours BNP: BNP (last 3 results) No results found for this basename: PROBNP,  in the last 8760 hours CBG: No results found for this basename: GLUCAP,  in the last 168 hours     Signed:  Tyris Eliot,CHRISTOPHER  [facility] 01/13/2013, 6:32 PM

## 2013-01-17 NOTE — Telephone Encounter (Signed)
Patient scheduled to see Dr. Johna Sheriff on 01/19/13 @ 8:50 am

## 2013-01-18 ENCOUNTER — Emergency Department (HOSPITAL_COMMUNITY)
Admission: EM | Admit: 2013-01-18 | Discharge: 2013-01-18 | Disposition: A | Discharge status: Home / Self Care | Payer: BC Managed Care – PPO | Attending: Emergency Medicine | Admitting: Emergency Medicine

## 2013-01-18 ENCOUNTER — Encounter (HOSPITAL_COMMUNITY): Payer: Self-pay | Admitting: Emergency Medicine

## 2013-01-18 DIAGNOSIS — Z79899 Other long term (current) drug therapy: Secondary | ICD-10-CM | POA: Insufficient documentation

## 2013-01-18 DIAGNOSIS — Z862 Personal history of diseases of the blood and blood-forming organs and certain disorders involving the immune mechanism: Secondary | ICD-10-CM | POA: Insufficient documentation

## 2013-01-18 DIAGNOSIS — I10 Essential (primary) hypertension: Secondary | ICD-10-CM | POA: Insufficient documentation

## 2013-01-18 DIAGNOSIS — K219 Gastro-esophageal reflux disease without esophagitis: Secondary | ICD-10-CM | POA: Insufficient documentation

## 2013-01-18 DIAGNOSIS — E119 Type 2 diabetes mellitus without complications: Secondary | ICD-10-CM | POA: Insufficient documentation

## 2013-01-18 DIAGNOSIS — R112 Nausea with vomiting, unspecified: Secondary | ICD-10-CM | POA: Insufficient documentation

## 2013-01-18 DIAGNOSIS — E875 Hyperkalemia: Secondary | ICD-10-CM | POA: Insufficient documentation

## 2013-01-18 DIAGNOSIS — F411 Generalized anxiety disorder: Secondary | ICD-10-CM | POA: Insufficient documentation

## 2013-01-18 LAB — CBC WITH DIFFERENTIAL/PLATELET
Basophils Relative: 0 % (ref 0–1)
Eosinophils Absolute: 0.2 10*3/uL (ref 0.0–0.7)
Eosinophils Relative: 2 % (ref 0–5)
HCT: 33.5 % — ABNORMAL LOW (ref 36.0–46.0)
Hemoglobin: 10.5 g/dL — ABNORMAL LOW (ref 12.0–15.0)
Lymphocytes Relative: 29 % (ref 12–46)
MCH: 21.5 pg — ABNORMAL LOW (ref 26.0–34.0)
MCHC: 31.3 g/dL (ref 30.0–36.0)
Monocytes Absolute: 0.6 10*3/uL (ref 0.1–1.0)
Neutro Abs: 5.1 10*3/uL (ref 1.7–7.7)
Neutrophils Relative %: 62 % (ref 43–77)
RBC: 4.88 MIL/uL (ref 3.87–5.11)

## 2013-01-18 LAB — BASIC METABOLIC PANEL
BUN: 6 mg/dL (ref 6–23)
BUN: 7 mg/dL (ref 6–23)
CO2: 24 mEq/L (ref 19–32)
CO2: 30 mEq/L (ref 19–32)
Calcium: 10.5 mg/dL (ref 8.4–10.5)
Chloride: 99 mEq/L (ref 96–112)
Creatinine, Ser: 0.74 mg/dL (ref 0.50–1.10)
GFR calc Af Amer: >90 mL/min (ref >90)
GFR calc non Af Amer: >90 mL/min (ref >90)
Glucose, Bld: 98 mg/dL (ref 70–99)
Glucose, Bld: 94 mg/dL (ref 70–99)
Potassium: 6.8 mEq/L (ref 3.5–5.1)
Potassium: 5.3 mEq/L — ABNORMAL HIGH (ref 3.5–5.1)
Sodium: 131 mEq/L — ABNORMAL LOW (ref 135–145)
Sodium: 138 mEq/L (ref 135–145)

## 2013-01-18 MED ORDER — DEXTROSE 50 % IV SOLN
1.0000 | Freq: Once | INTRAVENOUS | Status: AC
  Administered 2013-01-18: 50 mL via INTRAVENOUS
  Filled 2013-01-18: qty 50

## 2013-01-18 MED ORDER — SODIUM POLYSTYRENE SULFONATE 15 GM/60ML PO SUSP
60.0000 g | Freq: Once | ORAL | Status: AC
  Administered 2013-01-18: 60 g via ORAL
  Filled 2013-01-18: qty 60

## 2013-01-18 MED ORDER — SODIUM CHLORIDE 0.9 % IV BOLUS (SEPSIS)
1000.0000 mL | Freq: Once | INTRAVENOUS | Status: AC
  Administered 2013-01-18: 1000 mL via INTRAVENOUS

## 2013-01-18 MED ORDER — FUROSEMIDE 10 MG/ML IJ SOLN
40.0000 mg | Freq: Once | INTRAMUSCULAR | Status: AC
  Administered 2013-01-18: 40 mg via INTRAVENOUS
  Filled 2013-01-18: qty 4

## 2013-01-18 MED ORDER — SODIUM POLYSTYRENE SULFONATE 15 GM/60ML PO SUSP
ORAL | Status: AC
  Administered 2013-01-18: 15 g
  Filled 2013-01-18: qty 60

## 2013-01-18 MED ORDER — SODIUM POLYSTYRENE SULFONATE 15 GM/60ML PO SUSP
ORAL | Status: AC
  Administered 2013-01-18: 15 g
  Filled 2013-01-18: qty 120

## 2013-01-18 MED ORDER — SODIUM CHLORIDE 0.9 % IV SOLN
1.0000 g | Freq: Once | INTRAVENOUS | Status: AC
  Administered 2013-01-18: 1 g via INTRAVENOUS
  Filled 2013-01-18: qty 10

## 2013-01-18 MED ORDER — INSULIN ASPART 100 UNIT/ML ~~LOC~~ SOLN
10.0000 [IU] | Freq: Once | SUBCUTANEOUS | Status: AC
  Administered 2013-01-18: 10 [IU] via INTRAVENOUS
  Filled 2013-01-18: qty 1

## 2013-01-18 MED ORDER — SODIUM BICARBONATE 8.4 % IV SOLN
50.0000 meq | Freq: Once | INTRAVENOUS | Status: AC
  Administered 2013-01-18: 50 meq via INTRAVENOUS
  Filled 2013-01-18: qty 50

## 2013-01-18 NOTE — ED Provider Notes (Signed)
Medical screening examination/treatment/procedure(s) were conducted as a shared visit with non-physician practitioner(s) and myself.  I personally evaluated the patient during the encounter.  52yF with hyperK. Recent admit for hypoK. Pt reporting taking BID (80 daily). DC script instructed as daily. With lap band and slowed gastric emptying, possibly all the meds she got during hospitalization finally "caught up" and then additional supplementation overshot. No EKG changes. No complaints. Renal function normal. Tx'd with furosemide and kayexalate. Repeat potassium. Pt adamant about being discharged.   Repeat potassium improved. Would hold supplementation at this point. Needs repeat bmp in 1-2 days.   Raeford Razor, MD 01/19/13 [phone]

## 2013-01-18 NOTE — ED Notes (Addendum)
Pt states that she was seen by her PCP and labs drawn and had high potassium (>7.0). Pt states she was being treated previously for low potassium. Pt is A/O x4 and in NAD. Pt denies pain.

## 2013-01-18 NOTE — ED Provider Notes (Signed)
CSN: [id number]     Arrival date & time 01/18/13  1641 History   First MD Initiated Contact with Patient 01/18/13 1709     Chief Complaint  Patient presents with  . Abnormal Lab   (Consider location/radiation/quality/duration/timing/severity/associated sxs/prior Treatment) HPI Comments: Patient is a 52 y.o. female with a hx of gastric bypass who presents after she was found to be hyperkalemic at her PCPs office. Patient originally presented on 01/10/13 for hypokalemia with potassium of 2.0 secondary to N/V and intermittent HCTZ use. Magnesium was normal at this time with a level of 1.8. Patient's potassium was repleted during her admission to 3.8 and she was d/c on 8/39/14 with instruction to take KCL 40 meq daily for 1 week and to have electrolytes rechecked by her PCP. Patient has been taking double her recommended dose of KCL (80 meq) and states she went to her PCP today and her potassium level was found to be >7.0. She was subsequently told to come to the ED for evaluation. Patient denies pain, lightheadedness/dizziness, syncope, CP, SOB, cough, and N/V/D; patient states she feels completely asymptomatic.   The history is provided by the patient. No language interpreter was used.    Past Medical History  Diagnosis Date  . Nausea and vomiting     RESOLVED ON 03/03/12 AFTER DR REMOVED FLUID FROM GASTRIC BAND  . Weight loss   . Hypertension     NO PROBLEMS WITH HYPERTENSION SINCE WEIGHT LOSS  . High cholesterol     "before lap band" (01/10/2013)  . Diabetes mellitus     "before lap band" (01/10/2013)  . Anemia   . GERD (gastroesophageal reflux disease)   . Gout   . Anxiety    Past Surgical History  Procedure Laterality Date  . Laparoscopic gastric banding  03/09/2008  . Tonsillectomy    . Cesarean section  1980's  . Augmentation mammaplasty Bilateral 01/27/2010  . Augmentation mammaplasty Left 06/2010; 08/2010  . Gastric banding port revision  03/08/2012   Family History  Problem  Relation Age of Onset  . Heart disease Father 21    heart attack   . Cancer Father     melanoma  . Cancer Sister     lung   History  Substance Use Topics  . Smoking status: Never Smoker   . Smokeless tobacco: Never Used  . Alcohol Use: Yes     Comment: 01/10/2013 "maybe 6 shots/month   OB History   Grav Para Term Preterm Abortions TAB SAB Ect Mult Living                 Review of Systems  Respiratory: Negative for shortness of breath.   Cardiovascular: Negative for chest pain.  All other systems reviewed and are negative.   Allergies  Review of patient's allergies indicates no known allergies.  Home Medications   Current Outpatient Rx  Name  Route  Sig  Dispense  Refill  . ALPRAZolam (XANAX) 0.5 MG tablet   Oral   Take 0.5 mg by mouth 3 (three) times daily as needed. Anxiety         . Multiple Vitamins-Minerals (MULTIVITAMIN GUMMIES ADULT) CHEW   Oral   Chew 1 tablet by mouth daily.         . pantoprazole (PROTONIX) 40 MG tablet   Oral   Take 1 tablet (40 mg total) by mouth daily at 6 (six) AM.   30 tablet   0   . potassium chloride 20 MEQ/15ML (  10%) solution   Oral   Take 30 mLs (40 mEq total) by mouth daily.   240 mL   0    BP 106/60  Pulse 69  Temp(Src) 98.4 F (36.9 C) (Oral)  Resp 18  SpO2 99%  LMP 01/07/2013  Physical Exam  Nursing note and vitals reviewed. Constitutional: She is oriented to person, place, and time. She appears well-developed and well-nourished. No distress.  HENT:  Head: Normocephalic and atraumatic.  Eyes: Conjunctivae and EOM are normal. No scleral icterus.  Neck: Normal range of motion.  Cardiovascular: Normal rate, regular rhythm, normal heart sounds and intact distal pulses.   Pulmonary/Chest: Effort normal and breath sounds normal. No respiratory distress. She has no wheezes. She has no rales.  Musculoskeletal: Normal range of motion.  Neurological: She is alert and oriented to person, place, and time.  Skin:  Skin is warm and dry. No rash noted. She is not diaphoretic. No erythema. No pallor.  Psychiatric: She has a normal mood and affect. Her behavior is normal.    ED Course  Procedures (including critical care time) Labs Review Labs Reviewed  CBC WITH DIFFERENTIAL - Abnormal; Notable for the following:    Hemoglobin 10.5 (*)    HCT 33.5 (*)    MCV 68.6 (*)    MCH 21.5 (*)    RDW 17.4 (*)    Platelets 435 (*)    All other components within normal limits  BASIC METABOLIC PANEL - Abnormal; Notable for the following:    Sodium 131 (*)    Potassium 6.8 (*)    All other components within normal limits  BASIC METABOLIC PANEL - Abnormal; Notable for the following:    Potassium 5.3 (*)    All other components within normal limits    Date: 01/18/2013  Rate: 73  Rhythm: normal sinus rhythm  QRS Axis: normal  Intervals: normal  ST/T Wave abnormalities: normal; T waves more pronounced than prior, however no peaked T waves  Conduction Disutrbances:none  Narrative Interpretation: NSR without STEMI or peaked T waves  Old EKG Reviewed: Prior EKG on 01/10/13 with flattened T waves; otherwise unchanged I have personally reviewed and interpreted this EKG  Imaging Review No results found.  MDM   1. Hyperkalemia    Patient presents for hyperkalemia secondary to increased intake of PO KCl following an admission for hypokalemia. Patient well and nontoxic appearing on arrival; hemodynamically stable and afebrile. Physical exam findings unremarkable. EKG without peaked T waves, though they are more prominent than prior where T wave flattening was present. Potassium rechecked in ED and found to be 6.8. Patient insistent on d/c and requesting level be addressed and treated in ED. Kayexalate, D50, Insulin, and sodium bicarb ordered for symptoms in addition to IV Lasix in attempt to decrease potassium level.  Patient tolerating treatments well. She has become increasingly agitated as ED stay prolongs; very  vocal about desire to leave ED and go home. Potassium level rechecked and found to be 5.3. Glucose stable over 4 hour ED course. Patient has remained well and nontoxic appearing as well as hemodynamically stable. Given improvement in potassium level and hemodynamic stability, believe patient is appropriate for d/c with close PCP follow up and electrolyte level recheck in 24-48 hours. Have advised patient to discontinue oral potassium supplements as well. Strict return precautions discussed with patient who verbalizes understanding. Patient stable for d/c with no unaddressed concerns. Patient seen and evaluated, also, by Dr. Juleen China during this encounter.  Antony Madura, PA-C 01/18/13 2348

## 2013-01-19 ENCOUNTER — Encounter (INDEPENDENT_AMBULATORY_CARE_PROVIDER_SITE_OTHER): Payer: BC Managed Care – PPO | Admitting: General Surgery

## 2013-01-19 ENCOUNTER — Encounter (INDEPENDENT_AMBULATORY_CARE_PROVIDER_SITE_OTHER): Payer: BC Managed Care – PPO

## 2013-01-19 NOTE — ED Provider Notes (Signed)
Medical screening examination/treatment/procedure(s) were conducted as a shared visit with non-physician practitioner(s) and myself.  I personally evaluated the patient during the encounter.  Please see other note.   Raeford Razor, MD 01/19/13 [phone]

## 2013-01-30 ENCOUNTER — Telehealth (INDEPENDENT_AMBULATORY_CARE_PROVIDER_SITE_OTHER): Payer: Self-pay | Admitting: *Deleted

## 2013-01-30 NOTE — Telephone Encounter (Signed)
Message copied by Consuelo Pandy on Mon Jan 30, 2013 12:13 PM ------      Message from: Glenna Fellows T      Created: Mon Jan 30, 2013 11:39 AM       I will need to just see her back in the office in followup before getting any further imaging ------

## 2013-01-30 NOTE — Telephone Encounter (Signed)
Patient called in this morning to ask about going to have radiology done prior to coming to in to see Dr. Johna Sheriff on Wednesday.  Patient states Dr. Johna Sheriff wanted her to have something done so he could assess her band.  Explained that no orders have been placed for anything so a message would be sent to ask about this then we will let her know.  Patient states her band had slipped and this radiology was going to help Dr. Johna Sheriff see if there is inflammation.  Patient states understanding that we will ask then give her a call back.

## 2013-01-30 NOTE — Telephone Encounter (Signed)
Patient updated with below message at this time and states understanding.  This message is the response from my previous message on the same date.

## 2013-01-31 ENCOUNTER — Telehealth (INDEPENDENT_AMBULATORY_CARE_PROVIDER_SITE_OTHER): Payer: Self-pay

## 2013-01-31 NOTE — Telephone Encounter (Signed)
Message copied by Maryan Puls on Tue Jan 31, 2013 11:32 AM ------      Message from: Glenna Fellows T      Created: Tue Jan 31, 2013 10:11 AM      Regarding: RE: Test       no      ----- Message -----         From: Maryan Puls, CMA         Sent: 01/30/2013   4:52 PM           To: Mariella Saa, MD      Subject: Test                                                     I rec'd a message regarding a test that was to be scheduled to evaluate lap band.  I do not see any notes in EPIC or orders.  Patient has appointment on Wed to see you, do I need to schedule any test for her prior to the office visit on Wed?            Dariann Huckaba       ------

## 2013-01-31 NOTE — Telephone Encounter (Signed)
Left message for patient stating Dr. Johna Sheriff did not need any testing prior to office visit on Wed 02/01/13.

## 2013-02-01 ENCOUNTER — Encounter (INDEPENDENT_AMBULATORY_CARE_PROVIDER_SITE_OTHER): Payer: Self-pay | Admitting: General Surgery

## 2013-02-01 ENCOUNTER — Other Ambulatory Visit (INDEPENDENT_AMBULATORY_CARE_PROVIDER_SITE_OTHER): Payer: Self-pay

## 2013-02-01 ENCOUNTER — Ambulatory Visit (INDEPENDENT_AMBULATORY_CARE_PROVIDER_SITE_OTHER): Payer: BC Managed Care – PPO | Admitting: General Surgery

## 2013-02-01 VITALS — BP 124/70 | HR 92 | Temp 98.4°F | Resp 15 | Ht 65.0 in | Wt 152.6 lb

## 2013-02-01 DIAGNOSIS — Z9884 Bariatric surgery status: Secondary | ICD-10-CM

## 2013-02-01 NOTE — Progress Notes (Signed)
Chief complaint: Followup lap band, probable slip  History: Patient returns for followup with history of lap band placement October 2009 and excellent weight loss of approximately 100 pounds. She however had a slip one year ago which we revised with good results. In early August she began to experience worsening reflux and had some fluid removed here but right after that was found to be markedly hypokalemic and was admitted for treatment. At that time I saw her in the hospital and barium swallow showed what appears to be another small anterior slip. She was having reflux and dysphagia still and I removed all the fluid from her band. This has completely relieved her reflux and dysphagia. She has gained some weight but she is concerned about.  Exam: BP 124/70  Pulse 92  Temp(Src) 98.4 F (36.9 C) (Temporal)  Resp 15  Ht 5\' 5"  (1.651 m)  Wt 152 lb 9.6 oz (69.219 kg)  BMI 25.39 kg/m2  LMP 01/07/2013 Total weight loss 77 pounds, 17 pound increase from one month ago  General: Appears well Abdomen: Soft and nontender. Port site fine.  Assessment plan: Status post lap band with recent over restriction an apparent small slip. Had severe hypocalcemia and then subsequent hyperkalemia all resolved on recent lab work per patient. She Has had all fluid out for 3 weeks. We will plan to repeat her barium swallow next week and see her back in the office to see if possibly this has resolved with fluid removal. If not she may require revision.

## 2013-02-01 NOTE — Telephone Encounter (Signed)
Per Dr. Johna Sheriff patient need's to come to follow up appointment and radiology will be discussed.  Patient seen today and Barium Swallow ordered for 02/09/13 and return office visit on 02/10/13.

## 2013-02-08 ENCOUNTER — Ambulatory Visit
Admission: RE | Admit: 2013-02-08 | Discharge: 2013-02-08 | Disposition: A | Discharge status: Home / Self Care | Payer: BC Managed Care – PPO | Source: Ambulatory Visit | Attending: General Surgery | Admitting: General Surgery

## 2013-02-08 DIAGNOSIS — Z9884 Bariatric surgery status: Secondary | ICD-10-CM

## 2013-02-10 ENCOUNTER — Ambulatory Visit (INDEPENDENT_AMBULATORY_CARE_PROVIDER_SITE_OTHER): Payer: BC Managed Care – PPO | Admitting: General Surgery

## 2013-02-10 ENCOUNTER — Encounter (INDEPENDENT_AMBULATORY_CARE_PROVIDER_SITE_OTHER): Payer: Self-pay | Admitting: General Surgery

## 2013-02-10 VITALS — BP 118/64 | HR 72 | Temp 98.2°F | Resp 14 | Ht 65.0 in | Wt 157.2 lb

## 2013-02-10 DIAGNOSIS — Z4651 Encounter for fitting and adjustment of gastric lap band: Secondary | ICD-10-CM

## 2013-02-10 NOTE — Progress Notes (Signed)
Chief complaint: Followup lap band and esophageal dilatation  History: This returns for followup of her lap band. She was hospitalized recently with hypokalemia and was found to have marked esophageal dilatation and probably a small anterior slip with a history of previous slip revision.  This was last month. We removed all the fluid from her band at that time. We obtained a followup GI series 2 days ago which are reviewed. This shows marked improvement. She still has some degree of esophageal dilatation but her band appears in normal orientation without any evidence of slip. She currently feels well. Denies any reflux or regurgitation. She is very concerned about weight regain.  Exam: BP 118/64  Pulse 72  Temp(Src) 98.2 F (36.8 C) (Temporal)  Resp 14  Ht 5\' 5"  (1.651 m)  Wt 157 lb 3.2 oz (71.305 kg)  BMI 26.16 kg/m2 Total weight loss 73 pounds, up to 5 pounds from her last visit on 917 General: Appears well Abdomen: Soft and nontender. Incisions the port site looks fine  Assessment and plan: Status post lap band history of slip and revision and recent history of marked esophageal dilatation and possible small anterior slip. Her slip and most of her esophageal dilatation a result on recent upper GI series. The patient strongly was to go ahead with a fill which I think is reasonable. I also discussed with her that I think she has tended to push the lap band a little bit too hard in the past which have resulted in the above issues. The patient was feeling significantly better just prior to her recent hospitalization when she had 5 cc in the band and I think this probably would be a maximum fill amount in the future. I added 2 cc without difficulty today. She will return in one month.

## 2013-02-14 ENCOUNTER — Encounter (INDEPENDENT_AMBULATORY_CARE_PROVIDER_SITE_OTHER): Payer: Self-pay

## 2013-02-16 ENCOUNTER — Encounter (INDEPENDENT_AMBULATORY_CARE_PROVIDER_SITE_OTHER): Payer: BC Managed Care – PPO

## 2013-03-02 ENCOUNTER — Encounter (INDEPENDENT_AMBULATORY_CARE_PROVIDER_SITE_OTHER): Payer: Self-pay

## 2013-03-02 ENCOUNTER — Ambulatory Visit (INDEPENDENT_AMBULATORY_CARE_PROVIDER_SITE_OTHER): Payer: BC Managed Care – PPO | Admitting: Physician Assistant

## 2013-03-02 VITALS — BP 120/78 | HR 100 | Temp 98.7°F | Resp 15 | Ht 65.0 in | Wt 163.4 lb

## 2013-03-02 DIAGNOSIS — Z4651 Encounter for fitting and adjustment of gastric lap band: Secondary | ICD-10-CM

## 2013-03-02 NOTE — Progress Notes (Signed)
  HISTORY: Lori Summers is a 52 y.o.female who received an AP-Standard lap-band in October 2009 by Dr. Johna Sheriff. Please see Dr. Jamse Mead note from September 2014 regarding her recent history. She had fluid removed due to a period of hypokalemia, followed by hyperkalemia. She reports her potassium level as being normal now. She had 2 mL replaced by Dr. Johna Sheriff but she still has hunger and weight gain, as expected. We will be progressing conservatively with fills to avoid another slip, which she appears to have had again.  VITAL SIGNS: Filed Vitals:   03/02/13 1413  BP: 120/78  Pulse: 100  Temp: 98.7 F (37.1 C)  Resp: 15    PHYSICAL EXAM: Physical exam reveals a very well-appearing 52 y.o.female in no apparent distress Neurologic: Awake, alert, oriented Psych: Bright affect, conversant Respiratory: Breathing even and unlabored. No stridor or wheezing Abdomen: Soft, nontender, nondistended to palpation. Incisions well-healed. No incisional hernias. Port easily palpated. Extremities: Atraumatic, good range of motion.  ASSESMENT: 51 y.o.  female  s/p AP-Standard lap-band.   PLAN: The patient's port was accessed with a 20G Huber needle without difficulty. Clear fluid was aspirated and 1.5 mL saline was added to the port to give a total predicted volume of 3.5 mL. The patient was able to swallow water without difficulty following the procedure and was instructed to take clear liquids for the next 24-48 hours and advance slowly as tolerated.

## 2013-03-02 NOTE — Patient Instructions (Signed)

## 2013-03-09 ENCOUNTER — Encounter (INDEPENDENT_AMBULATORY_CARE_PROVIDER_SITE_OTHER): Payer: BC Managed Care – PPO

## 2013-03-09 ENCOUNTER — Telehealth: Payer: Self-pay | Admitting: *Deleted

## 2013-03-09 NOTE — Telephone Encounter (Signed)
Has gained 17 lbs since in hospital a few months ago.  Considering conversion to sleeve d/t multiple band slips and issues with hypokalemia. Needs documentation of current wt and BMI. Coming at 4:45 today for [place] scan.

## 2013-03-23 DIAGNOSIS — K912 Postsurgical malabsorption, not elsewhere classified: Secondary | ICD-10-CM | POA: Insufficient documentation

## 2013-03-23 DIAGNOSIS — F32A Depression, unspecified: Secondary | ICD-10-CM | POA: Insufficient documentation

## 2013-03-23 DIAGNOSIS — F419 Anxiety disorder, unspecified: Secondary | ICD-10-CM | POA: Insufficient documentation

## 2013-03-23 DIAGNOSIS — E663 Overweight: Secondary | ICD-10-CM | POA: Insufficient documentation

## 2013-03-30 ENCOUNTER — Encounter (INDEPENDENT_AMBULATORY_CARE_PROVIDER_SITE_OTHER): Payer: BC Managed Care – PPO

## 2013-03-31 ENCOUNTER — Encounter (INDEPENDENT_AMBULATORY_CARE_PROVIDER_SITE_OTHER): Payer: BC Managed Care – PPO | Admitting: General Surgery

## 2013-04-20 ENCOUNTER — Encounter (INDEPENDENT_AMBULATORY_CARE_PROVIDER_SITE_OTHER): Payer: BC Managed Care – PPO

## 2014-03-27 ENCOUNTER — Ambulatory Visit
Admission: RE | Admit: 2014-03-27 | Discharge: 2014-03-27 | Disposition: A | Discharge status: Home / Self Care | Payer: BC Managed Care – PPO | Source: Ambulatory Visit | Attending: Physician Assistant | Admitting: Physician Assistant

## 2014-03-27 ENCOUNTER — Other Ambulatory Visit: Payer: Self-pay | Admitting: Physician Assistant

## 2014-03-27 DIAGNOSIS — T1490XA Injury, unspecified, initial encounter: Secondary | ICD-10-CM

## 2014-05-23 DIAGNOSIS — D509 Iron deficiency anemia, unspecified: Secondary | ICD-10-CM | POA: Insufficient documentation

## 2016-02-07 ENCOUNTER — Other Ambulatory Visit: Payer: Self-pay | Admitting: Family Medicine

## 2016-02-07 DIAGNOSIS — N926 Irregular menstruation, unspecified: Secondary | ICD-10-CM

## 2016-02-21 ENCOUNTER — Other Ambulatory Visit: Payer: Self-pay

## 2016-06-08 ENCOUNTER — Other Ambulatory Visit: Payer: Self-pay | Admitting: Family Medicine

## 2016-06-08 DIAGNOSIS — Z1231 Encounter for screening mammogram for malignant neoplasm of breast: Secondary | ICD-10-CM

## 2016-06-17 ENCOUNTER — Ambulatory Visit: Payer: Self-pay

## 2016-12-31 ENCOUNTER — Emergency Department (HOSPITAL_COMMUNITY): Payer: BLUE CROSS/BLUE SHIELD

## 2016-12-31 ENCOUNTER — Encounter (HOSPITAL_COMMUNITY): Payer: Self-pay | Admitting: Emergency Medicine

## 2016-12-31 ENCOUNTER — Inpatient Hospital Stay (HOSPITAL_COMMUNITY)
Admission: EM | Admit: 2016-12-31 | Discharge: 2017-01-03 | DRG: 511 | Disposition: A | Discharge status: Home / Self Care | Payer: BLUE CROSS/BLUE SHIELD | Attending: Orthopedic Surgery | Admitting: Orthopedic Surgery

## 2016-12-31 ENCOUNTER — Observation Stay (HOSPITAL_COMMUNITY): Payer: BLUE CROSS/BLUE SHIELD

## 2016-12-31 DIAGNOSIS — S63015A Dislocation of distal radioulnar joint of left wrist, initial encounter: Secondary | ICD-10-CM | POA: Diagnosis present

## 2016-12-31 DIAGNOSIS — Y92410 Unspecified street and highway as the place of occurrence of the external cause: Secondary | ICD-10-CM

## 2016-12-31 DIAGNOSIS — Z419 Encounter for procedure for purposes other than remedying health state, unspecified: Secondary | ICD-10-CM

## 2016-12-31 DIAGNOSIS — S2232XA Fracture of one rib, left side, initial encounter for closed fracture: Secondary | ICD-10-CM | POA: Diagnosis present

## 2016-12-31 DIAGNOSIS — E876 Hypokalemia: Secondary | ICD-10-CM | POA: Diagnosis not present

## 2016-12-31 DIAGNOSIS — S63012A Subluxation of distal radioulnar joint of left wrist, initial encounter: Secondary | ICD-10-CM | POA: Diagnosis present

## 2016-12-31 DIAGNOSIS — T1490XA Injury, unspecified, initial encounter: Secondary | ICD-10-CM

## 2016-12-31 DIAGNOSIS — Z9884 Bariatric surgery status: Secondary | ICD-10-CM

## 2016-12-31 DIAGNOSIS — G5612 Other lesions of median nerve, left upper limb: Secondary | ICD-10-CM | POA: Diagnosis present

## 2016-12-31 DIAGNOSIS — S52372A Galeazzi's fracture of left radius, initial encounter for closed fracture: Secondary | ICD-10-CM | POA: Diagnosis not present

## 2016-12-31 DIAGNOSIS — K219 Gastro-esophageal reflux disease without esophagitis: Secondary | ICD-10-CM

## 2016-12-31 DIAGNOSIS — R52 Pain, unspecified: Secondary | ICD-10-CM

## 2016-12-31 DIAGNOSIS — S060X9A Concussion with loss of consciousness of unspecified duration, initial encounter: Secondary | ICD-10-CM | POA: Diagnosis present

## 2016-12-31 DIAGNOSIS — D62 Acute posthemorrhagic anemia: Secondary | ICD-10-CM | POA: Diagnosis present

## 2016-12-31 DIAGNOSIS — Z7952 Long term (current) use of systemic steroids: Secondary | ICD-10-CM

## 2016-12-31 DIAGNOSIS — G5621 Lesion of ulnar nerve, right upper limb: Secondary | ICD-10-CM | POA: Diagnosis present

## 2016-12-31 DIAGNOSIS — G5620 Lesion of ulnar nerve, unspecified upper limb: Secondary | ICD-10-CM | POA: Clinically undetermined

## 2016-12-31 DIAGNOSIS — S5290XA Unspecified fracture of unspecified forearm, initial encounter for closed fracture: Secondary | ICD-10-CM | POA: Diagnosis present

## 2016-12-31 DIAGNOSIS — T148XXA Other injury of unspecified body region, initial encounter: Secondary | ICD-10-CM

## 2016-12-31 HISTORY — DX: Other lesions of median nerve, left upper limb: G56.12

## 2016-12-31 HISTORY — DX: Galeazzi's fracture of left radius, initial encounter for closed fracture: S52.372A

## 2016-12-31 HISTORY — DX: Lesion of ulnar nerve, unspecified upper limb: G56.20

## 2016-12-31 HISTORY — DX: Fracture of one rib, left side, initial encounter for closed fracture: S22.32XA

## 2016-12-31 HISTORY — DX: Bariatric surgery status: Z98.84

## 2016-12-31 HISTORY — DX: Subluxation of distal radioulnar joint of left wrist, initial encounter: S63.012A

## 2016-12-31 LAB — COMPREHENSIVE METABOLIC PANEL
ALT: 20 U/L (ref 14–54)
AST: 30 U/L (ref 15–41)
Albumin: 3.7 g/dL (ref 3.5–5.0)
Alkaline Phosphatase: 73 U/L (ref 38–126)
Anion gap: 14 (ref 5–15)
BILIRUBIN TOTAL: 0.6 mg/dL (ref 0.3–1.2)
BUN: 11 mg/dL (ref 6–20)
CO2: 27 mmol/L (ref 22–32)
Calcium: 9.8 mg/dL (ref 8.9–10.3)
Chloride: 99 mmol/L — ABNORMAL LOW (ref 101–111)
Creatinine, Ser: 1 mg/dL (ref 0.44–1.00)
GFR calc Af Amer: >60 mL/min (ref >60)
Glucose, Bld: 108 mg/dL — ABNORMAL HIGH (ref 65–99)
POTASSIUM: 2.6 mmol/L — AB (ref 3.5–5.1)
Sodium: 140 mmol/L (ref 135–145)
Total Protein: 6.7 g/dL (ref 6.5–8.1)

## 2016-12-31 LAB — I-STAT CHEM 8, ED
BUN: 12 mg/dL (ref 6–20)
CALCIUM ION: 1.15 mmol/L (ref 1.15–1.40)
CREATININE: 0.8 mg/dL (ref 0.44–1.00)
Chloride: 99 mmol/L — ABNORMAL LOW (ref 101–111)
GLUCOSE: 110 mg/dL — AB (ref 65–99)
HCT: 35 % — ABNORMAL LOW (ref 36.0–46.0)
Hemoglobin: 11.9 g/dL — ABNORMAL LOW (ref 12.0–15.0)
Potassium: 2.4 mmol/L — CL (ref 3.5–5.1)
Sodium: 140 mmol/L (ref 135–145)
TCO2: 30 mmol/L (ref 0–100)

## 2016-12-31 LAB — URINALYSIS, ROUTINE W REFLEX MICROSCOPIC
Bilirubin Urine: NEGATIVE
Glucose, UA: NEGATIVE mg/dL
Hgb urine dipstick: NEGATIVE
KETONES UR: 5 mg/dL — AB
Nitrite: NEGATIVE
PH: 7 (ref 5.0–8.0)
Protein, ur: NEGATIVE mg/dL
SPECIFIC GRAVITY, URINE: 1.008 (ref 1.005–1.030)

## 2016-12-31 LAB — CBC
HEMATOCRIT: 35.5 % — AB (ref 36.0–46.0)
Hemoglobin: 11.2 g/dL — ABNORMAL LOW (ref 12.0–15.0)
MCH: 21.7 pg — ABNORMAL LOW (ref 26.0–34.0)
MCHC: 31.5 g/dL (ref 30.0–36.0)
MCV: 68.8 fL — ABNORMAL LOW (ref 78.0–100.0)
PLATELETS: 241 10*3/uL (ref 150–400)
RBC: 5.16 MIL/uL — AB (ref 3.87–5.11)
RDW: 15.2 % (ref 11.5–15.5)
WBC: 9 10*3/uL (ref 4.0–10.5)

## 2016-12-31 LAB — I-STAT CG4 LACTIC ACID, ED: LACTIC ACID, VENOUS: 1.62 mmol/L (ref 0.5–1.9)

## 2016-12-31 LAB — SAMPLE TO BLOOD BANK

## 2016-12-31 LAB — PROTIME-INR
INR: 0.94
Prothrombin Time: 12.5 seconds (ref 11.4–15.2)

## 2016-12-31 LAB — CDS SEROLOGY

## 2016-12-31 MED ORDER — ACETAMINOPHEN 650 MG RE SUPP
650.0000 mg | Freq: Four times a day (QID) | RECTAL | Status: DC | PRN
Start: 2016-12-31 — End: 2017-01-01

## 2016-12-31 MED ORDER — METHOCARBAMOL 1000 MG/10ML IJ SOLN
1000.0000 mg | Freq: Four times a day (QID) | INTRAVENOUS | Status: DC | PRN
  Filled 2016-12-31: qty 10

## 2016-12-31 MED ORDER — MORPHINE SULFATE (PF) 4 MG/ML IV SOLN
4.0000 mg | Freq: Once | INTRAVENOUS | Status: AC
  Administered 2016-12-31: 4 mg via INTRAVENOUS
  Filled 2016-12-31: qty 1

## 2016-12-31 MED ORDER — CHLORHEXIDINE GLUCONATE 4 % EX LIQD
60.0000 mL | Freq: Once | CUTANEOUS | Status: AC
  Administered 2017-01-01: 4 via TOPICAL
  Filled 2016-12-31: qty 15
  Filled 2016-12-31: qty 60

## 2016-12-31 MED ORDER — MORPHINE SULFATE (PF) 4 MG/ML IV SOLN
1.0000 mg | INTRAVENOUS | Status: DC | PRN
  Administered 2017-01-01 (×5): 2 mg via INTRAVENOUS
  Filled 2016-12-31 (×5): qty 1

## 2016-12-31 MED ORDER — OXYCODONE-ACETAMINOPHEN 5-325 MG PO TABS
1.0000 | ORAL_TABLET | Freq: Once | ORAL | Status: AC
  Administered 2016-12-31: 1 via ORAL
  Filled 2016-12-31: qty 1

## 2016-12-31 MED ORDER — ACETAMINOPHEN 325 MG PO TABS: 650.0000 mg | ORAL_TABLET | Freq: Four times a day (QID) | ORAL | Status: DC | PRN

## 2016-12-31 MED ORDER — METHOCARBAMOL 500 MG PO TABS
500.0000 mg | ORAL_TABLET | Freq: Four times a day (QID) | ORAL | Status: DC | PRN
  Administered 2017-01-02 – 2017-01-03 (×3): 1000 mg via ORAL
  Filled 2016-12-31 (×3): qty 2

## 2016-12-31 MED ORDER — DOCUSATE SODIUM 100 MG PO CAPS
100.0000 mg | ORAL_CAPSULE | Freq: Two times a day (BID) | ORAL | Status: DC
  Filled 2016-12-31: qty 1

## 2016-12-31 MED ORDER — POTASSIUM CHLORIDE IN NACL 20-0.9 MEQ/L-% IV SOLN: INTRAVENOUS | Status: DC

## 2016-12-31 MED ORDER — CEFAZOLIN SODIUM-DEXTROSE 2-4 GM/100ML-% IV SOLN
2.0000 g | INTRAVENOUS | Status: AC
  Administered 2017-01-01: 2 g via INTRAVENOUS
  Filled 2016-12-31 (×2): qty 100

## 2016-12-31 MED ORDER — POTASSIUM CHLORIDE CRYS ER 20 MEQ PO TBCR
20.0000 meq | EXTENDED_RELEASE_TABLET | Freq: Once | ORAL | Status: AC
  Administered 2016-12-31: 20 meq via ORAL
  Filled 2016-12-31: qty 1

## 2016-12-31 MED ORDER — HYDROCODONE-ACETAMINOPHEN 7.5-325 MG PO TABS: 1.0000 | ORAL_TABLET | Freq: Four times a day (QID) | ORAL | Status: DC | PRN

## 2016-12-31 NOTE — Progress Notes (Signed)
Orthopedic Tech Progress Note Patient Details:  Lori RavenKaren Summers [birth year] [id number]  Ortho Devices Type of Ortho Device: Arm sling, Sugartong splint Ortho Device/Splint Location: lue plaster sugartong  Ortho Device/Splint Interventions: Ordered, Application   Trinna PostMartinez, Dione Petron J 12/31/2016, 11:52 PM

## 2016-12-31 NOTE — ED Notes (Signed)
Ortho tech at bedside 
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
## 2016-12-31 NOTE — Progress Notes (Signed)
Orthopedic Tech Progress Note Patient Details:  Lori RavenKaren Summers 1961-03-07 [id number]  Ortho Devices Type of Ortho Device: Ace wrap, Volar splint Ortho Device/Splint Location: Level 2 Trauma Ortho Device/Splint Interventions: Application   Lori FordyceJennifer C Jolanta Summers 12/31/2016, 7:48 PM
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
## 2017-01-01 ENCOUNTER — Encounter (HOSPITAL_COMMUNITY): Payer: Self-pay | Admitting: Anesthesiology

## 2017-01-01 ENCOUNTER — Observation Stay (HOSPITAL_COMMUNITY): Payer: BLUE CROSS/BLUE SHIELD

## 2017-01-01 ENCOUNTER — Observation Stay (HOSPITAL_COMMUNITY): Payer: BLUE CROSS/BLUE SHIELD | Admitting: Certified Registered"

## 2017-01-01 ENCOUNTER — Encounter (HOSPITAL_COMMUNITY)
Admission: EM | Disposition: A | Discharge status: Home / Self Care | Payer: Self-pay | Source: Home / Self Care | Attending: Orthopedic Surgery

## 2017-01-01 HISTORY — PX: CLOSED REDUCTION WRIST FRACTURE: SHX1091

## 2017-01-01 HISTORY — PX: ORIF RADIAL FRACTURE: SHX5113

## 2017-01-01 LAB — HIV ANTIBODY (ROUTINE TESTING W REFLEX): HIV Screen 4th Generation wRfx: NONREACTIVE

## 2017-01-01 LAB — COMPREHENSIVE METABOLIC PANEL
ALT: 17 U/L (ref 14–54)
AST: 25 U/L (ref 15–41)
Albumin: 3.2 g/dL — ABNORMAL LOW (ref 3.5–5.0)
Alkaline Phosphatase: 67 U/L (ref 38–126)
Anion gap: 10 (ref 5–15)
BUN: 9 mg/dL (ref 6–20)
CHLORIDE: 99 mmol/L — AB (ref 101–111)
CO2: 30 mmol/L (ref 22–32)
Calcium: 8.6 mg/dL — ABNORMAL LOW (ref 8.9–10.3)
Creatinine, Ser: 0.72 mg/dL (ref 0.44–1.00)
Glucose, Bld: 150 mg/dL — ABNORMAL HIGH (ref 65–99)
POTASSIUM: 2.4 mmol/L — AB (ref 3.5–5.1)
SODIUM: 139 mmol/L (ref 135–145)
Total Bilirubin: 0.7 mg/dL (ref 0.3–1.2)
Total Protein: 5.9 g/dL — ABNORMAL LOW (ref 6.5–8.1)

## 2017-01-01 LAB — CBC
HCT: 31.8 % — ABNORMAL LOW (ref 36.0–46.0)
Hemoglobin: 9.8 g/dL — ABNORMAL LOW (ref 12.0–15.0)
MCH: 21 pg — AB (ref 26.0–34.0)
MCHC: 30.8 g/dL (ref 30.0–36.0)
MCV: 68.2 fL — AB (ref 78.0–100.0)
PLATELETS: 224 10*3/uL (ref 150–400)
RBC: 4.66 MIL/uL (ref 3.87–5.11)
RDW: 15.2 % (ref 11.5–15.5)
WBC: 11.3 10*3/uL — AB (ref 4.0–10.5)

## 2017-01-01 LAB — MAGNESIUM
MAGNESIUM: 1.7 mg/dL (ref 1.7–2.4)
MAGNESIUM: 1.6 mg/dL — AB (ref 1.7–2.4)

## 2017-01-01 LAB — BASIC METABOLIC PANEL
ANION GAP: 7 (ref 5–15)
BUN: 8 mg/dL (ref 6–20)
CALCIUM: 8.4 mg/dL — AB (ref 8.9–10.3)
CO2: 29 mmol/L (ref 22–32)
Chloride: 103 mmol/L (ref 101–111)
Creatinine, Ser: 0.74 mg/dL (ref 0.44–1.00)
GFR calc Af Amer: >60 mL/min (ref >60)
GLUCOSE: 132 mg/dL — AB (ref 65–99)
POTASSIUM: 2.7 mmol/L — AB (ref 3.5–5.1)
SODIUM: 139 mmol/L (ref 135–145)

## 2017-01-01 LAB — PHOSPHORUS: Phosphorus: 2.9 mg/dL (ref 2.5–4.6)

## 2017-01-01 LAB — ETHANOL: Alcohol, Ethyl (B): <5 mg/dL (ref <5)

## 2017-01-01 SURGERY — OPEN REDUCTION INTERNAL FIXATION (ORIF) RADIAL FRACTURE
Anesthesia: General | Site: Arm Lower | Laterality: Left

## 2017-01-01 MED ORDER — SUCCINYLCHOLINE CHLORIDE 200 MG/10ML IV SOSY
PREFILLED_SYRINGE | INTRAVENOUS | Status: AC
  Filled 2017-01-01: qty 10

## 2017-01-01 MED ORDER — ROCURONIUM BROMIDE 10 MG/ML (PF) SYRINGE
PREFILLED_SYRINGE | INTRAVENOUS | Status: DC | PRN
Start: 2017-01-01 — End: 2017-01-01

## 2017-01-01 MED ORDER — ROPIVACAINE HCL 5 MG/ML IJ SOLN
INTRAMUSCULAR | Status: DC | PRN
  Administered 2017-01-01: 30 mL via PERINEURAL

## 2017-01-01 MED ORDER — DOCUSATE SODIUM 100 MG PO CAPS: 100.0000 mg | ORAL_CAPSULE | Freq: Two times a day (BID) | ORAL | 0 refills | Status: DC

## 2017-01-01 MED ORDER — POTASSIUM CHLORIDE CRYS ER 20 MEQ PO TBCR
20.0000 meq | EXTENDED_RELEASE_TABLET | Freq: Two times a day (BID) | ORAL | Status: DC
  Administered 2017-01-01 (×2): 20 meq via ORAL
  Filled 2017-01-01 (×2): qty 1

## 2017-01-01 MED ORDER — CEFAZOLIN SODIUM-DEXTROSE 1-4 GM/50ML-% IV SOLN
1.0000 g | Freq: Four times a day (QID) | INTRAVENOUS | Status: AC
  Administered 2017-01-02 (×3): 1 g via INTRAVENOUS
  Filled 2017-01-01 (×3): qty 50

## 2017-01-01 MED ORDER — MAGNESIUM OXIDE 400 (241.3 MG) MG PO TABS: 400.0000 mg | ORAL_TABLET | Freq: Once | ORAL | Status: DC

## 2017-01-01 MED ORDER — METOCLOPRAMIDE HCL 5 MG PO TABS: 5.0000 mg | ORAL_TABLET | Freq: Three times a day (TID) | ORAL | Status: DC | PRN

## 2017-01-01 MED ORDER — HYDROMORPHONE HCL 1 MG/ML IJ SOLN: 0.2500 mg | INTRAMUSCULAR | Status: DC | PRN

## 2017-01-01 MED ORDER — FENTANYL CITRATE (PF) 100 MCG/2ML IJ SOLN
INTRAMUSCULAR | Status: AC
  Administered 2017-01-01: 50 ug via INTRAVENOUS
  Filled 2017-01-01: qty 2

## 2017-01-01 MED ORDER — PROPOFOL 10 MG/ML IV BOLUS
INTRAVENOUS | Status: DC | PRN
  Administered 2017-01-01: 130 mg via INTRAVENOUS

## 2017-01-01 MED ORDER — SODIUM CHLORIDE 0.9 % IV SOLN
INTRAVENOUS | Status: DC
  Administered 2017-01-01 – 2017-01-02 (×3): via INTRAVENOUS

## 2017-01-01 MED ORDER — HYDROMORPHONE HCL 1 MG/ML IJ SOLN
1.0000 mg | INTRAMUSCULAR | Status: DC | PRN
  Administered 2017-01-01 – 2017-01-02 (×3): 2 mg via INTRAVENOUS
  Filled 2017-01-01 (×3): qty 2

## 2017-01-01 MED ORDER — FENTANYL CITRATE (PF) 100 MCG/2ML IJ SOLN
50.0000 ug | Freq: Once | INTRAMUSCULAR | Status: AC
  Administered 2017-01-01: 50 ug via INTRAVENOUS

## 2017-01-01 MED ORDER — DEXAMETHASONE SODIUM PHOSPHATE 10 MG/ML IJ SOLN
INTRAMUSCULAR | Status: AC
  Filled 2017-01-01: qty 1

## 2017-01-01 MED ORDER — SUGAMMADEX SODIUM 200 MG/2ML IV SOLN
INTRAVENOUS | Status: DC | PRN
  Administered 2017-01-01: 200 mg via INTRAVENOUS

## 2017-01-01 MED ORDER — ONDANSETRON HCL 4 MG/2ML IJ SOLN
INTRAMUSCULAR | Status: DC | PRN
  Administered 2017-01-01: 4 mg via INTRAVENOUS

## 2017-01-01 MED ORDER — MIDAZOLAM HCL 2 MG/2ML IJ SOLN
INTRAMUSCULAR | Status: AC
Start: 2017-01-01 — End: 2017-01-01
  Administered 2017-01-01: 2 mg via INTRAVENOUS
  Filled 2017-01-01: qty 2

## 2017-01-01 MED ORDER — ONDANSETRON HCL 4 MG/2ML IJ SOLN
4.0000 mg | Freq: Four times a day (QID) | INTRAMUSCULAR | Status: DC | PRN
  Administered 2017-01-01: 4 mg via INTRAVENOUS
  Filled 2017-01-01: qty 2

## 2017-01-01 MED ORDER — LACTATED RINGERS IV SOLN: INTRAVENOUS | Status: DC

## 2017-01-01 MED ORDER — OXYCODONE HCL 5 MG PO TABS: 5.0000 mg | ORAL_TABLET | Freq: Four times a day (QID) | ORAL | 0 refills | Status: DC | PRN

## 2017-01-01 MED ORDER — METOCLOPRAMIDE HCL 5 MG/ML IJ SOLN: 5.0000 mg | Freq: Three times a day (TID) | INTRAMUSCULAR | Status: DC | PRN

## 2017-01-01 MED ORDER — ACETAMINOPHEN 325 MG PO TABS: 650.0000 mg | ORAL_TABLET | Freq: Four times a day (QID) | ORAL | Status: DC | PRN

## 2017-01-01 MED ORDER — ONDANSETRON HCL 4 MG/2ML IJ SOLN
INTRAMUSCULAR | Status: AC
  Filled 2017-01-01: qty 2

## 2017-01-01 MED ORDER — OXYCODONE HCL 5 MG PO TABS: 5.0000 mg | ORAL_TABLET | ORAL | Status: DC | PRN

## 2017-01-01 MED ORDER — METHOCARBAMOL 500 MG PO TABS: 500.0000 mg | ORAL_TABLET | Freq: Four times a day (QID) | ORAL | 1 refills | Status: DC | PRN

## 2017-01-01 MED ORDER — ROCURONIUM BROMIDE 10 MG/ML (PF) SYRINGE
PREFILLED_SYRINGE | INTRAVENOUS | Status: AC
  Filled 2017-01-01: qty 5

## 2017-01-01 MED ORDER — LIDOCAINE 2% (20 MG/ML) 5 ML SYRINGE
INTRAMUSCULAR | Status: DC | PRN
  Administered 2017-01-01: 50 mg via INTRAVENOUS

## 2017-01-01 MED ORDER — POTASSIUM CHLORIDE 10 MEQ/100ML IV SOLN
10.0000 meq | INTRAVENOUS | Status: AC
  Administered 2017-01-01 (×3): 10 meq via INTRAVENOUS
  Filled 2017-01-01 (×4): qty 100

## 2017-01-01 MED ORDER — FENTANYL CITRATE (PF) 250 MCG/5ML IJ SOLN
INTRAMUSCULAR | Status: AC
  Filled 2017-01-01: qty 5

## 2017-01-01 MED ORDER — DEXAMETHASONE SODIUM PHOSPHATE 10 MG/ML IJ SOLN
INTRAMUSCULAR | Status: DC | PRN
  Administered 2017-01-01: 10 mg via INTRAVENOUS

## 2017-01-01 MED ORDER — SUCCINYLCHOLINE CHLORIDE 200 MG/10ML IV SOSY
PREFILLED_SYRINGE | INTRAVENOUS | Status: DC | PRN
  Administered 2017-01-01: 100 mg via INTRAVENOUS

## 2017-01-01 MED ORDER — LACTATED RINGERS IV SOLN
INTRAVENOUS | Status: DC | PRN
  Administered 2017-01-01 (×2): via INTRAVENOUS

## 2017-01-01 MED ORDER — SCOPOLAMINE 1 MG/3DAYS TD PT72
MEDICATED_PATCH | TRANSDERMAL | Status: DC | PRN
  Administered 2017-01-01: 1 via TRANSDERMAL

## 2017-01-01 MED ORDER — POLYETHYLENE GLYCOL 3350 17 G PO PACK
17.0000 g | PACK | Freq: Every day | ORAL | Status: DC
  Administered 2017-01-02 – 2017-01-03 (×2): 17 g via ORAL
  Filled 2017-01-01 (×2): qty 1

## 2017-01-01 MED ORDER — HYDROCODONE-ACETAMINOPHEN 7.5-325 MG PO TABS
1.0000 | ORAL_TABLET | Freq: Four times a day (QID) | ORAL | Status: DC | PRN
  Administered 2017-01-02 (×2): 2 via ORAL
  Filled 2017-01-01 (×2): qty 2

## 2017-01-01 MED ORDER — FENTANYL CITRATE (PF) 250 MCG/5ML IJ SOLN
INTRAMUSCULAR | Status: DC | PRN
  Administered 2017-01-01: 50 ug via INTRAVENOUS

## 2017-01-01 MED ORDER — ACETAMINOPHEN 650 MG RE SUPP: 650.0000 mg | Freq: Four times a day (QID) | RECTAL | Status: DC | PRN

## 2017-01-01 MED ORDER — ROCURONIUM BROMIDE 10 MG/ML (PF) SYRINGE
PREFILLED_SYRINGE | INTRAVENOUS | Status: DC | PRN
  Administered 2017-01-01: 50 mg via INTRAVENOUS

## 2017-01-01 MED ORDER — LIDOCAINE 2% (20 MG/ML) 5 ML SYRINGE
INTRAMUSCULAR | Status: AC
  Filled 2017-01-01: qty 5

## 2017-01-01 MED ORDER — MAGNESIUM SULFATE 2 GM/50ML IV SOLN
2.0000 g | Freq: Once | INTRAVENOUS | Status: AC
  Administered 2017-01-01: 2 g via INTRAVENOUS
  Filled 2017-01-01 (×2): qty 50

## 2017-01-01 MED ORDER — HYDROCODONE-ACETAMINOPHEN 7.5-325 MG PO TABS: 1.0000 | ORAL_TABLET | Freq: Four times a day (QID) | ORAL | 0 refills | Status: DC | PRN

## 2017-01-01 MED ORDER — SCOPOLAMINE 1 MG/3DAYS TD PT72
MEDICATED_PATCH | TRANSDERMAL | Status: AC
  Filled 2017-01-01: qty 1

## 2017-01-01 MED ORDER — 0.9 % SODIUM CHLORIDE (POUR BTL) OPTIME
TOPICAL | Status: DC | PRN
  Administered 2017-01-01: 1000 mL

## 2017-01-01 MED ORDER — DOCUSATE SODIUM 100 MG PO CAPS
100.0000 mg | ORAL_CAPSULE | Freq: Two times a day (BID) | ORAL | Status: DC
Start: 2017-01-01 — End: 2017-01-03
  Administered 2017-01-01 – 2017-01-03 (×4): 100 mg via ORAL
  Filled 2017-01-01 (×4): qty 1

## 2017-01-01 MED ORDER — PROMETHAZINE HCL 25 MG/ML IJ SOLN: 6.2500 mg | INTRAMUSCULAR | Status: DC | PRN

## 2017-01-01 MED ORDER — TRAZODONE HCL 50 MG PO TABS: 150.0000 mg | ORAL_TABLET | Freq: Every evening | ORAL | Status: DC | PRN

## 2017-01-01 MED ORDER — MIDAZOLAM HCL 2 MG/2ML IJ SOLN
2.0000 mg | Freq: Once | INTRAMUSCULAR | Status: AC
  Administered 2017-01-01: 2 mg via INTRAVENOUS

## 2017-01-01 MED ORDER — SUGAMMADEX SODIUM 200 MG/2ML IV SOLN
INTRAVENOUS | Status: AC
  Filled 2017-01-01: qty 2

## 2017-01-01 MED ORDER — ONDANSETRON HCL 4 MG PO TABS: 4.0000 mg | ORAL_TABLET | Freq: Four times a day (QID) | ORAL | Status: DC | PRN

## 2017-01-01 MED ORDER — MIDAZOLAM HCL 2 MG/2ML IJ SOLN
INTRAMUSCULAR | Status: AC
  Filled 2017-01-01: qty 2

## 2017-01-01 MED ORDER — POTASSIUM CHLORIDE 10 MEQ/100ML IV SOLN
10.0000 meq | INTRAVENOUS | Status: AC
  Administered 2017-01-01 (×4): 10 meq via INTRAVENOUS
  Filled 2017-01-01 (×4): qty 100

## 2017-01-01 SURGICAL SUPPLY — 93 items
APL SKNCLS STERI-STRIP NONHPOA (GAUZE/BANDAGES/DRESSINGS)
BANDAGE ACE 3X5.8 VEL STRL LF (GAUZE/BANDAGES/DRESSINGS) ×2 IMPLANT
BANDAGE ACE 4X5 VEL STRL LF (GAUZE/BANDAGES/DRESSINGS) ×2 IMPLANT
BANDAGE ACE 6X5 VEL STRL LF (GAUZE/BANDAGES/DRESSINGS) ×1 IMPLANT
BENZOIN TINCTURE PRP APPL 2/3 (GAUZE/BANDAGES/DRESSINGS) IMPLANT
BIT DRILL 2.8 QR W/DEPTH MARKS (BIT) ×1 IMPLANT
BLADE AVERAGE 25X9 (BLADE) ×1 IMPLANT
BLADE CLIPPER SURG (BLADE) ×2 IMPLANT
BLADE SURG 10 STRL SS (BLADE) ×1 IMPLANT
BNDG CMPR 9X4 STRL LF SNTH (GAUZE/BANDAGES/DRESSINGS) ×1
BNDG COHESIVE 4X5 TAN STRL (GAUZE/BANDAGES/DRESSINGS) ×1 IMPLANT
BNDG ESMARK 4X9 LF (GAUZE/BANDAGES/DRESSINGS) ×2 IMPLANT
BNDG GAUZE ELAST 4 BULKY (GAUZE/BANDAGES/DRESSINGS) ×1 IMPLANT
BRUSH SCRUB SURG 4.25 DISP (MISCELLANEOUS) ×4 IMPLANT
CLEANER TIP ELECTROSURG 2X2 (MISCELLANEOUS) ×2 IMPLANT
COVER SURGICAL LIGHT HANDLE (MISCELLANEOUS) ×3 IMPLANT
CUFF TOURNIQUET SINGLE 18IN (TOURNIQUET CUFF) ×1 IMPLANT
CUFF TOURNIQUET SINGLE 24IN (TOURNIQUET CUFF) IMPLANT
DECANTER SPIKE VIAL GLASS SM (MISCELLANEOUS) IMPLANT
DRAPE C-ARM 42X72 X-RAY (DRAPES) ×1 IMPLANT
DRAPE C-ARMOR (DRAPES) ×2 IMPLANT
DRAPE INCISE IOBAN 66X45 STRL (DRAPES) IMPLANT
DRAPE SURG 17X23 STRL (DRAPES) ×1 IMPLANT
DRAPE U-SHAPE 47X51 STRL (DRAPES) ×2 IMPLANT
DRSG ADAPTIC 3X8 NADH LF (GAUZE/BANDAGES/DRESSINGS) IMPLANT
DRSG EMULSION OIL 3X3 NADH (GAUZE/BANDAGES/DRESSINGS) ×1 IMPLANT
DRSG PAD ABDOMINAL 8X10 ST (GAUZE/BANDAGES/DRESSINGS) ×1 IMPLANT
ELECT REM PT RETURN 9FT ADLT (ELECTROSURGICAL) ×2
ELECTRODE REM PT RTRN 9FT ADLT (ELECTROSURGICAL) ×1 IMPLANT
FACESHIELD WRAPAROUND (MASK) IMPLANT
FACESHIELD WRAPAROUND OR TEAM (MASK) IMPLANT
GAUZE SPONGE 4X4 12PLY STRL (GAUZE/BANDAGES/DRESSINGS) ×2 IMPLANT
GAUZE XEROFORM 1X8 LF (GAUZE/BANDAGES/DRESSINGS) IMPLANT
GAUZE XEROFORM 5X9 LF (GAUZE/BANDAGES/DRESSINGS) ×1 IMPLANT
GLOVE BIO SURGEON STRL SZ 6.5 (GLOVE) ×1 IMPLANT
GLOVE BIO SURGEON STRL SZ7.5 (GLOVE) ×2 IMPLANT
GLOVE BIO SURGEON STRL SZ8 (GLOVE) ×2 IMPLANT
GLOVE BIOGEL PI IND STRL 6.5 (GLOVE) IMPLANT
GLOVE BIOGEL PI IND STRL 7.5 (GLOVE) ×1 IMPLANT
GLOVE BIOGEL PI IND STRL 8 (GLOVE) ×1 IMPLANT
GLOVE BIOGEL PI INDICATOR 6.5 (GLOVE) ×1
GLOVE BIOGEL PI INDICATOR 7.5 (GLOVE) ×1
GLOVE BIOGEL PI INDICATOR 8 (GLOVE) ×1
GLOVE SURG SS PI 6.5 STRL IVOR (GLOVE) ×1 IMPLANT
GLOVE SURG SS PI 8.5 STRL IVOR (GLOVE) ×1
GLOVE SURG SS PI 8.5 STRL STRW (GLOVE) IMPLANT
GOWN STRL REUS W/ TWL LRG LVL3 (GOWN DISPOSABLE) ×2 IMPLANT
GOWN STRL REUS W/ TWL XL LVL3 (GOWN DISPOSABLE) ×1 IMPLANT
GOWN STRL REUS W/TWL LRG LVL3 (GOWN DISPOSABLE) ×4
GOWN STRL REUS W/TWL XL LVL3 (GOWN DISPOSABLE) ×2
KIT BASIN OR (CUSTOM PROCEDURE TRAY) ×2 IMPLANT
KIT ROOM TURNOVER OR (KITS) ×2 IMPLANT
MANIFOLD NEPTUNE II (INSTRUMENTS) ×1 IMPLANT
NDL 1/2 CIR CATGUT .05X1.09 (NEEDLE) ×1 IMPLANT
NEEDLE 1/2 CIR CATGUT .05X1.09 (NEEDLE) IMPLANT
NS IRRIG 1000ML POUR BTL (IV SOLUTION) ×2 IMPLANT
PACK ORTHO EXTREMITY (CUSTOM PROCEDURE TRAY) ×2 IMPLANT
PAD ARMBOARD 7.5X6 YLW CONV (MISCELLANEOUS) ×3 IMPLANT
PAD CAST 3X4 CTTN HI CHSV (CAST SUPPLIES) IMPLANT
PAD CAST 4YDX4 CTTN HI CHSV (CAST SUPPLIES) ×1 IMPLANT
PADDING CAST COTTON 3X4 STRL (CAST SUPPLIES) ×2
PADDING CAST COTTON 4X4 STRL (CAST SUPPLIES)
PASSER SUT SWANSON 36MM LOOP (INSTRUMENTS) ×1 IMPLANT
PLATE 8 HOLE RADIUS (Plate) ×1 IMPLANT
SCREW NONLOCK HEX 3.5X12 (Screw) ×6 IMPLANT
SCRUB POVIDONE IODINE 4 OZ (MISCELLANEOUS) ×1 IMPLANT
SOL PREP POV-IOD 4OZ 10% (MISCELLANEOUS) ×1 IMPLANT
SPLINT PLASTER CAST XFAST 4X15 (CAST SUPPLIES) IMPLANT
SPLINT PLASTER CAST XFAST 5X30 (CAST SUPPLIES) ×1 IMPLANT
SPLINT PLASTER XFAST SET 5X30 (CAST SUPPLIES)
SPLINT PLASTER XTRA FAST SET 4 (CAST SUPPLIES) ×1
SPONGE LAP 18X18 X RAY DECT (DISPOSABLE) ×3 IMPLANT
STAPLER VISISTAT 35W (STAPLE) ×2 IMPLANT
STRIP CLOSURE SKIN 1/2X4 (GAUZE/BANDAGES/DRESSINGS) IMPLANT
SUCTION FRAZIER HANDLE 10FR (MISCELLANEOUS) ×1
SUCTION TUBE FRAZIER 10FR DISP (MISCELLANEOUS) ×1 IMPLANT
SUT ETHILON 3 0 PS 1 (SUTURE) ×2 IMPLANT
SUT FIBERWIRE #2 38 T-5 BLUE (SUTURE)
SUT PDS AB 2-0 CT1 27 (SUTURE) IMPLANT
SUT PROLENE 3 0 PS 2 (SUTURE) ×4 IMPLANT
SUT VIC AB 0 CT1 27 (SUTURE) ×4
SUT VIC AB 0 CT1 27XBRD ANBCTR (SUTURE) ×2 IMPLANT
SUT VIC AB 2-0 CT1 27 (SUTURE) ×4
SUT VIC AB 2-0 CT1 TAPERPNT 27 (SUTURE) ×2 IMPLANT
SUT VIC AB 2-0 CT3 27 (SUTURE) IMPLANT
SUTURE FIBERWR #2 38 T-5 BLUE (SUTURE) IMPLANT
SYR CONTROL 10ML LL (SYRINGE) ×2 IMPLANT
TOWEL OR 17X24 6PK STRL BLUE (TOWEL DISPOSABLE) IMPLANT
TOWEL OR 17X26 10 PK STRL BLUE (TOWEL DISPOSABLE) ×4 IMPLANT
TUBE CONNECTING 12X1/4 (SUCTIONS) ×2 IMPLANT
UNDERPAD 30X30 (UNDERPADS AND DIAPERS) ×3 IMPLANT
WATER STERILE IRR 1000ML POUR (IV SOLUTION) ×1 IMPLANT
YANKAUER SUCT BULB TIP NO VENT (SUCTIONS) ×2 IMPLANT

## 2017-01-01 NOTE — Anesthesia Procedure Notes (Signed)
Procedure Name: Intubation Date/Time: 01/01/2017 5:19 PM Performed by: Teressa Lower Pre-anesthesia Checklist: Patient identified, Emergency Drugs available, Suction available and Patient being monitored Patient Re-evaluated:Patient Re-evaluated prior to induction Oxygen Delivery Method: Circle system utilized Preoxygenation: Pre-oxygenation with 100% oxygen Induction Type: IV induction, Rapid sequence and Cricoid Pressure applied Ventilation: Mask ventilation without difficulty Laryngoscope Size: Mac and 3 Grade View: Grade I Tube type: Oral Tube size: 7.0 mm Number of attempts: 1 Airway Equipment and Method: Stylet and Oral airway Placement Confirmation: ETT inserted through vocal cords under direct vision,  positive ETCO2 and breath sounds checked- equal and bilateral Secured at: 21 cm Tube secured with: Tape Dental Injury: Teeth and Oropharynx as per pre-operative assessment

## 2017-01-01 NOTE — Anesthesia Procedure Notes (Signed)
Anesthesia Regional Block: Supraclavicular block   Pre-Anesthetic Checklist: ,, timeout performed, Correct Patient, Correct Site, Correct Laterality, Correct Procedure, Correct Position, site marked, Risks and benefits discussed,  Surgical consent,  Pre-op evaluation,  At surgeon's request and post-op pain management  Laterality: Left  Prep: chloraprep       Needles:  Injection technique: Single-shot  Needle Type: Echogenic Needle     Needle Length: 9cm  Needle Gauge: 21     Additional Needles:   Procedures: ultrasound guided,,,,,,,,  Narrative:  Start time: 01/01/2017 4:50 PM End time: 01/01/2017 4:55 PM Injection made incrementally with aspirations every 5 mL.  Performed by: Personally  Anesthesiologist: Shona Simpson D  Additional Notes: Tolerated well. No issues.

## 2017-01-01 NOTE — Progress Notes (Signed)
Received a call from Lab, [facility] of critical potassium level of 2.7 at 9:18 am. Called and notified Charlyne Petrin at 0924am.

## 2017-01-01 NOTE — Progress Notes (Signed)
CRITICAL VALUE ALERT  Critical Value:  K+ 2.4  Date & Time Notied:  01/01/2017 0525  Provider Notified: MD aware of low K+, potassium runs have previously been ordered and currently being infused  Orders Received/Actions taken: potassium run being infused

## 2017-01-01 NOTE — Transfer of Care (Signed)
Immediate Anesthesia Transfer of Care Note  Patient: Lori Summers  Procedure(s) Performed: Procedure(s): OPEN REDUCTION INTERNAL FIXATION (ORIF) RADIAL FRACTURE (Left) CLOSED REDUCTION WRIST (Left)  Patient Location: PACU  Anesthesia Type:GA combined with regional for post-op pain  Level of Consciousness: alert , oriented and drowsy  Airway & Oxygen Therapy: Patient Spontanous Breathing and Patient connected to face mask oxygen  Post-op Assessment: Report given to RN and Post -op Vital signs reviewed and stable  Post vital signs: Reviewed and stable  Last Vitals:  Vitals:   01/01/17 1655 01/01/17 1700  BP:  (!) 157/70  Pulse: 69 67  Resp: 14 14  Temp:    SpO2: 100% 100%    Last Pain:  Vitals:   01/01/17 1700  TempSrc:   PainSc: 0-No pain         Complications: No apparent anesthesia complications

## 2017-01-01 NOTE — Anesthesia Preprocedure Evaluation (Addendum)
Anesthesia Evaluation  Patient identified by MRN, date of birth, ID band Patient awake    Reviewed: Allergy & Precautions, NPO status , Patient's Chart, lab work & pertinent test results  Airway Mallampati: I  TM Distance: >3 FB Neck ROM: Full    Dental  (+) Teeth Intact, Dental Advisory Given   Pulmonary neg pulmonary ROS,    breath sounds clear to auscultation       Cardiovascular negative cardio ROS   Rhythm:Regular Rate:Normal     Neuro/Psych negative neurological ROS     GI/Hepatic negative GI ROS, Neg liver ROS,   Endo/Other  negative endocrine ROS  Renal/GU negative Renal ROS     Musculoskeletal negative musculoskeletal ROS (+)   Abdominal   Peds  Hematology negative hematology ROS (+)   Anesthesia Other Findings Day of surgery medications reviewed with the patient.  Reproductive/Obstetrics                            Anesthesia Physical Anesthesia Plan  ASA: II  Anesthesia Plan: General   Post-op Pain Management:  Regional for Post-op pain   Induction: Intravenous, Rapid sequence and Cricoid pressure planned  PONV Risk Score and Plan: 4 or greater and Ondansetron, Dexamethasone, Midazolam, Scopolamine patch - Pre-op and Propofol infusion  Airway Management Planned: Oral ETT  Additional Equipment:   Intra-op Plan:   Post-operative Plan: Extubation in OR  Informed Consent: I have reviewed the patients History and Physical, chart, labs and discussed the procedure including the risks, benefits and alternatives for the proposed anesthesia with the patient or authorized representative who has indicated his/her understanding and acceptance.   Dental advisory given  Plan Discussed with: CRNA  Anesthesia Plan Comments:        Anesthesia Quick Evaluation

## 2017-01-01 NOTE — Anesthesia Postprocedure Evaluation (Signed)
Anesthesia Post Note  Patient: Lori Summers  Procedure(s) Performed: Procedure(s) (LRB): OPEN REDUCTION INTERNAL FIXATION (ORIF) RADIAL FRACTURE (Left) CLOSED REDUCTION WRIST (Left)     Patient location during evaluation: PACU Anesthesia Type: General Level of consciousness: awake and alert Pain management: pain level controlled Vital Signs Assessment: post-procedure vital signs reviewed and stable Respiratory status: spontaneous breathing, nonlabored ventilation, respiratory function stable and patient connected to nasal cannula oxygen Cardiovascular status: blood pressure returned to baseline and stable Postop Assessment: no signs of nausea or vomiting Anesthetic complications: no    Last Vitals:  Vitals:   01/01/17 2015 01/01/17 2042  BP: (!) 158/95 (!) 170/87  Pulse: 72 79  Resp: 14   Temp:  37 C  SpO2: 94% 94%    Last Pain:  Vitals:   01/01/17 2042  TempSrc: Oral  PainSc:                  Kennieth Rad
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
## 2017-01-01 NOTE — Discharge Instructions (Signed)
Orthopaedic Trauma Service Discharge Instructions   General Discharge Instructions  WEIGHT BEARING STATUS: Nonweightbearing left arm. Do not pick up any objects with left hand/arm  RANGE OF MOTION/ACTIVITY: Ok to move L elbow and left fingers. Sling for comfort  Wound Care: do not remove splint. Do not get splint wet   Diet: as you were eating previously.  Can use over the counter stool softeners and bowel preparations, such as Miralax, to help with bowel movements.  Narcotics can be constipating.  Be sure to drink plenty of fluids  PAIN MEDICATION USE AND EXPECTATIONS  You have likely been given narcotic medications to help control your pain.  After a traumatic event that results in an fracture (broken bone) with or without surgery, it is ok to use narcotic pain medications to help control one's pain.  We understand that everyone responds to pain differently and each individual patient will be evaluated on a regular basis for the continued need for narcotic medications. Ideally, narcotic medication use should last no more than 6-8 weeks (coinciding with fracture healing).   As a patient it is your responsibility as well to monitor narcotic medication use and report the amount and frequency you use these medications when you come to your office visit.   We would also advise that if you are using narcotic medications, you should take a dose prior to therapy to maximize you participation.  IF YOU ARE ON NARCOTIC MEDICATIONS IT IS NOT PERMISSIBLE TO OPERATE A MOTOR VEHICLE (MOTORCYCLE/CAR/TRUCK/MOPED) OR HEAVY MACHINERY DO NOT MIX NARCOTICS WITH OTHER CNS (CENTRAL NERVOUS SYSTEM) DEPRESSANTS SUCH AS ALCOHOL   STOP SMOKING OR USING NICOTINE PRODUCTS!!!!  As discussed nicotine severely impairs your body's ability to heal surgical and traumatic wounds but also impairs bone healing.  Wounds and bone heal by forming microscopic blood vessels (angiogenesis) and nicotine is a vasoconstrictor  (essentially, shrinks blood vessels).  Therefore, if vasoconstriction occurs to these microscopic blood vessels they essentially disappear and are unable to deliver necessary nutrients to the healing tissue.  This is one modifiable factor that you can do to dramatically increase your chances of healing your injury.    (This means no smoking, no nicotine gum, patches, etc)  DO NOT USE NONSTEROIDAL ANTI-INFLAMMATORY DRUGS (NSAID'S)  Using products such as Advil (ibuprofen), Aleve (naproxen), Motrin (ibuprofen) for additional pain control during fracture healing can delay and/or prevent the healing response.  If you would like to take over the counter (OTC) medication, Tylenol (acetaminophen) is ok.  However, some narcotic medications that are given for pain control contain acetaminophen as well. Therefore, you should not exceed more than 4000 mg of tylenol in a day if you do not have liver disease.  Also note that there are may OTC medicines, such as cold medicines and allergy medicines that my contain tylenol as well.  If you have any questions about medications and/or interactions please ask your doctor/PA or your pharmacist.      ICE AND ELEVATE INJURED/OPERATIVE EXTREMITY  Using ice and elevating the injured extremity above your heart can help with swelling and pain control.  Icing in a pulsatile fashion, such as 20 minutes on and 20 minutes off, can be followed.    Do not place ice directly on skin. Make sure there is a barrier between to skin and the ice pack.    Using frozen items such as frozen peas works well as the conform nicely to the are that needs to be iced.  USE AN ACE  WRAP OR TED HOSE FOR SWELLING CONTROL  In addition to icing and elevation, Ace wraps or TED hose are used to help limit and resolve swelling.  It is recommended to use Ace wraps or TED hose until you are informed to stop.    When using Ace Wraps start the wrapping distally (farthest away from the body) and wrap proximally  (closer to the body)   Example: If you had surgery on your leg or thing and you do not have a splint on, start the ace wrap at the toes and work your way up to the thigh        If you had surgery on your upper extremity and do not have a splint on, start the ace wrap at your fingers and work your way up to the upper arm  IF YOU ARE IN A SPLINT OR CAST DO NOT [place]   If your splint gets wet for any reason please contact the office immediately. You may shower in your splint or cast as long as you keep it dry.  This can be done by wrapping in a cast cover or garbage back (or similar)  Do Not stick any thing down your splint or cast such as pencils, money, or hangers to try and scratch yourself with.  If you feel itchy take benadryl as prescribed on the bottle for itching  IF YOU ARE IN A CAM BOOT (BLACK BOOT)  You may remove boot periodically. Perform daily dressing changes as noted below.  Wash the liner of the boot regularly and wear a sock when wearing the boot. It is recommended that you sleep in the boot until told otherwise  CALL THE OFFICE WITH ANY QUESTIONS OR CONCERNS: [phone]

## 2017-01-01 NOTE — Brief Op Note (Signed)
12/31/2016 - 01/01/2017  9:58 PM  PATIENT:  Quenten Raven  56 y.o. female  PRE-OPERATIVE DIAGNOSIS:  LEFT GALEAZZI FRACTURE (RADIAL SHAFT FRACTURE AND DRUJ DISRUPTION)  POST-OPERATIVE DIAGNOSIS:  LEFT GALEAZZI FRACTURE (RADIAL SHAFT FRACTURE AND DRUJ DISRUPTION)  PROCEDURE:  Procedure(s): 1. OPEN REDUCTION INTERNAL FIXATION (ORIF) RADIAL FRACTURE (Left) 2. CLOSED REDUCTION WRIST DISTAL RADIOULNAR JOINT (DRUJ) (Left)  SURGEON:  Surgeon(s) and Role:    Myrene Galas, MD - Primary  PHYSICIAN ASSISTANT: Montez Morita, PA-C  ANESTHESIA:   general  EBL:  Total I/O In: 100 [I.V.:100] Out: 900 [Urine:900]  BLOOD ADMINISTERED:none  DRAINS: none   LOCAL MEDICATIONS USED:  NONE  SPECIMEN:  No Specimen  DISPOSITION OF SPECIMEN:  N/A  COUNTS:  YES  TOURNIQUET:  * No tourniquets in log *  DICTATION: TBA  PLAN OF CARE: Admit to inpatient   PATIENT DISPOSITION:  PACU - hemodynamically stable.   Delay start of Pharmacological VTE agent (>24hrs) due to surgical blood loss or risk of bleeding: no

## 2017-01-02 ENCOUNTER — Encounter (HOSPITAL_COMMUNITY): Payer: Self-pay | Admitting: Physician Assistant

## 2017-01-02 DIAGNOSIS — S63015A Dislocation of distal radioulnar joint of left wrist, initial encounter: Secondary | ICD-10-CM | POA: Diagnosis present

## 2017-01-02 DIAGNOSIS — Y92410 Unspecified street and highway as the place of occurrence of the external cause: Secondary | ICD-10-CM | POA: Diagnosis not present

## 2017-01-02 DIAGNOSIS — Z9884 Bariatric surgery status: Secondary | ICD-10-CM

## 2017-01-02 DIAGNOSIS — S52372A Galeazzi's fracture of left radius, initial encounter for closed fracture: Secondary | ICD-10-CM | POA: Diagnosis present

## 2017-01-02 DIAGNOSIS — G5621 Lesion of ulnar nerve, right upper limb: Secondary | ICD-10-CM | POA: Diagnosis present

## 2017-01-02 DIAGNOSIS — S2232XA Fracture of one rib, left side, initial encounter for closed fracture: Secondary | ICD-10-CM | POA: Diagnosis present

## 2017-01-02 DIAGNOSIS — G5612 Other lesions of median nerve, left upper limb: Secondary | ICD-10-CM

## 2017-01-02 DIAGNOSIS — S63012A Subluxation of distal radioulnar joint of left wrist, initial encounter: Secondary | ICD-10-CM

## 2017-01-02 DIAGNOSIS — G5620 Lesion of ulnar nerve, unspecified upper limb: Secondary | ICD-10-CM

## 2017-01-02 DIAGNOSIS — K219 Gastro-esophageal reflux disease without esophagitis: Secondary | ICD-10-CM

## 2017-01-02 DIAGNOSIS — S060X9A Concussion with loss of consciousness of unspecified duration, initial encounter: Secondary | ICD-10-CM | POA: Diagnosis present

## 2017-01-02 DIAGNOSIS — D62 Acute posthemorrhagic anemia: Secondary | ICD-10-CM | POA: Diagnosis present

## 2017-01-02 DIAGNOSIS — E876 Hypokalemia: Secondary | ICD-10-CM | POA: Diagnosis present

## 2017-01-02 DIAGNOSIS — Z7952 Long term (current) use of systemic steroids: Secondary | ICD-10-CM | POA: Diagnosis not present

## 2017-01-02 HISTORY — DX: Subluxation of distal radioulnar joint of left wrist, initial encounter: S63.012A

## 2017-01-02 HISTORY — DX: Other lesions of median nerve, left upper limb: G56.12

## 2017-01-02 HISTORY — DX: Bariatric surgery status: Z98.84

## 2017-01-02 HISTORY — DX: Gastro-esophageal reflux disease without esophagitis: K21.9

## 2017-01-02 HISTORY — DX: Fracture of one rib, left side, initial encounter for closed fracture: S22.32XA

## 2017-01-02 HISTORY — DX: Lesion of ulnar nerve, unspecified upper limb: G56.20

## 2017-01-02 HISTORY — DX: Galeazzi's fracture of left radius, initial encounter for closed fracture: S52.372A

## 2017-01-02 HISTORY — DX: Rider (driver) (passenger) of other motorcycle injured in unspecified traffic accident, initial encounter: V29.99XA

## 2017-01-02 LAB — BASIC METABOLIC PANEL
ANION GAP: 8 (ref 5–15)
ANION GAP: 8 (ref 5–15)
BUN: 6 mg/dL (ref 6–20)
BUN: 5 mg/dL — ABNORMAL LOW (ref 6–20)
CALCIUM: 8.4 mg/dL — AB (ref 8.9–10.3)
CALCIUM: 8.6 mg/dL — AB (ref 8.9–10.3)
CO2: 28 mmol/L (ref 22–32)
CO2: 28 mmol/L (ref 22–32)
Chloride: 102 mmol/L (ref 101–111)
Chloride: 102 mmol/L (ref 101–111)
Creatinine, Ser: 0.6 mg/dL (ref 0.44–1.00)
Creatinine, Ser: 0.68 mg/dL (ref 0.44–1.00)
GFR calc Af Amer: >60 mL/min (ref >60)
GFR calc Af Amer: >60 mL/min (ref >60)
GFR calc non Af Amer: >60 mL/min (ref >60)
GFR calc non Af Amer: >60 mL/min (ref >60)
GLUCOSE: 199 mg/dL — AB (ref 65–99)
GLUCOSE: 183 mg/dL — AB (ref 65–99)
Potassium: 2.9 mmol/L — ABNORMAL LOW (ref 3.5–5.1)
Potassium: 3.7 mmol/L (ref 3.5–5.1)
Sodium: 138 mmol/L (ref 135–145)
Sodium: 138 mmol/L (ref 135–145)

## 2017-01-02 LAB — MAGNESIUM: Magnesium: 1.9 mg/dL (ref 1.7–2.4)

## 2017-01-02 MED ORDER — POTASSIUM CHLORIDE CRYS ER 20 MEQ PO TBCR
40.0000 meq | EXTENDED_RELEASE_TABLET | Freq: Two times a day (BID) | ORAL | Status: DC
  Administered 2017-01-02 (×2): 40 meq via ORAL
  Filled 2017-01-02 (×2): qty 2

## 2017-01-02 MED ORDER — CALCIUM CARBONATE ANTACID 500 MG PO CHEW
1.0000 | CHEWABLE_TABLET | Freq: Three times a day (TID) | ORAL | Status: DC
  Administered 2017-01-02 – 2017-01-03 (×4): 200 mg via ORAL
  Filled 2017-01-02 (×4): qty 1

## 2017-01-02 MED ORDER — MAGNESIUM OXIDE 400 (241.3 MG) MG PO TABS
400.0000 mg | ORAL_TABLET | Freq: Every day | ORAL | Status: DC
  Administered 2017-01-02 – 2017-01-03 (×2): 400 mg via ORAL
  Filled 2017-01-02 (×2): qty 1

## 2017-01-02 MED ORDER — POTASSIUM CHLORIDE CRYS ER 20 MEQ PO TBCR
40.0000 meq | EXTENDED_RELEASE_TABLET | Freq: Once | ORAL | Status: AC
  Administered 2017-01-02: 40 meq via ORAL
  Filled 2017-01-02: qty 2

## 2017-01-02 MED ORDER — POTASSIUM CHLORIDE IN NACL 20-0.9 MEQ/L-% IV SOLN
INTRAVENOUS | Status: DC
  Administered 2017-01-02 (×3): via INTRAVENOUS
  Filled 2017-01-02 (×3): qty 1000

## 2017-01-02 MED ORDER — OXYCODONE HCL 5 MG/5ML PO SOLN
5.0000 mg | ORAL | Status: DC | PRN
  Administered 2017-01-02 – 2017-01-03 (×6): 10 mg via ORAL
  Filled 2017-01-02 (×7): qty 10
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
## 2017-01-03 LAB — BASIC METABOLIC PANEL
Anion gap: 8 (ref 5–15)
CHLORIDE: 105 mmol/L (ref 101–111)
CO2: 24 mmol/L (ref 22–32)
Calcium: 8.2 mg/dL — ABNORMAL LOW (ref 8.9–10.3)
Creatinine, Ser: 0.58 mg/dL (ref 0.44–1.00)
GFR calc Af Amer: >60 mL/min (ref >60)
GFR calc non Af Amer: >60 mL/min (ref >60)
Glucose, Bld: 112 mg/dL — ABNORMAL HIGH (ref 65–99)
POTASSIUM: 4.5 mmol/L (ref 3.5–5.1)
SODIUM: 137 mmol/L (ref 135–145)

## 2017-01-03 LAB — URINE CULTURE

## 2017-01-03 MED ORDER — POLYETHYLENE GLYCOL 3350 17 G PO PACK: 17.0000 g | PACK | Freq: Every day | ORAL | 0 refills | Status: DC

## 2017-01-03 MED ORDER — CALCIUM CARBONATE ANTACID 500 MG PO CHEW: 1.0000 | CHEWABLE_TABLET | Freq: Three times a day (TID) | ORAL | Status: DC

## 2017-01-03 MED ORDER — OXYCODONE HCL 5 MG/5ML PO SOLN: ORAL | 0 refills | Status: DC
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
## 2017-01-03 NOTE — Progress Notes (Signed)
Removed IV, provided discharge education/instructions, all questions and concerns addressed, patient not in distress, discharged home accompanied by husband. 
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
## 2017-01-04 ENCOUNTER — Encounter (HOSPITAL_COMMUNITY): Payer: Self-pay | Admitting: Orthopedic Surgery

## 2017-01-04 ENCOUNTER — Encounter (INDEPENDENT_AMBULATORY_CARE_PROVIDER_SITE_OTHER): Payer: Self-pay
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
## 2018-05-27 ENCOUNTER — Other Ambulatory Visit (INDEPENDENT_AMBULATORY_CARE_PROVIDER_SITE_OTHER): Payer: Self-pay

## 2018-05-27 ENCOUNTER — Encounter (INDEPENDENT_AMBULATORY_CARE_PROVIDER_SITE_OTHER): Payer: Self-pay | Admitting: Family Medicine

## 2018-05-27 ENCOUNTER — Ambulatory Visit (INDEPENDENT_AMBULATORY_CARE_PROVIDER_SITE_OTHER): Payer: No Typology Code available for payment source | Admitting: Family Medicine

## 2018-05-27 VITALS — BP 160/92 | HR 80 | Ht 64.0 in | Wt 169.0 lb

## 2018-05-27 DIAGNOSIS — Z0000 Encounter for general adult medical examination without abnormal findings: Secondary | ICD-10-CM

## 2018-05-27 DIAGNOSIS — R413 Other amnesia: Secondary | ICD-10-CM | POA: Diagnosis not present

## 2018-05-27 DIAGNOSIS — R002 Palpitations: Secondary | ICD-10-CM | POA: Diagnosis not present

## 2018-05-27 DIAGNOSIS — R635 Abnormal weight gain: Secondary | ICD-10-CM | POA: Diagnosis not present

## 2018-05-27 DIAGNOSIS — Z9884 Bariatric surgery status: Secondary | ICD-10-CM

## 2018-05-27 MED ORDER — ALPRAZOLAM 0.5 MG PO TABS: 0.2500 mg | ORAL_TABLET | Freq: Every evening | ORAL | 3 refills | Status: DC | PRN
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
## 2018-06-14 ENCOUNTER — Telehealth (INDEPENDENT_AMBULATORY_CARE_PROVIDER_SITE_OTHER): Payer: Self-pay | Admitting: Family Medicine

## 2018-06-14 NOTE — Telephone Encounter (Signed)
EKG looks normal, no sign of arrhythmia.  Ferritin is very low at 4.  Would recommend iron supplements daily along with increased intake of iron-rich foods.  Recheck in 3 months.  Lipids normal.  Thyroid normal.  A1C slightly elevated at 6.2.  Be sure to minimize intake of processed carbohydrates and sweets.  All other labs look good.

## 2018-06-16 NOTE — Telephone Encounter (Signed)
I called and advised patient of her results and instructions.

## 2018-07-15 ENCOUNTER — Telehealth (INDEPENDENT_AMBULATORY_CARE_PROVIDER_SITE_OTHER): Payer: Self-pay | Admitting: Family Medicine

## 2018-07-15 MED ORDER — DIPHENOXYLATE-ATROPINE 2.5-0.025 MG PO TABS: 1.0000 | ORAL_TABLET | Freq: Four times a day (QID) | ORAL | 0 refills | Status: DC | PRN

## 2018-07-15 NOTE — Telephone Encounter (Signed)
Patient called stating that she has had diarrhea since Wednesday and has taken some pepto pills, but nothing else.  She wanted to know what Dr. Prince Rome would recommend for her to take or would he call something into the pharmacy.  She uses [facility] on [address].  CB#[phone].  Thank you.

## 2018-07-15 NOTE — Telephone Encounter (Signed)
Please advise 

## 2018-07-15 NOTE — Telephone Encounter (Signed)
Rx sent 

## 2018-07-15 NOTE — Telephone Encounter (Signed)
Left full message on the patient's voice mail - check with the pharmacy. She is to call and let us know if she has no improvement.

## 2018-08-09 ENCOUNTER — Encounter (INDEPENDENT_AMBULATORY_CARE_PROVIDER_SITE_OTHER): Payer: Self-pay | Admitting: Family Medicine

## 2018-08-10 ENCOUNTER — Encounter (INDEPENDENT_AMBULATORY_CARE_PROVIDER_SITE_OTHER): Payer: Self-pay | Admitting: Family Medicine

## 2018-09-07 ENCOUNTER — Other Ambulatory Visit (INDEPENDENT_AMBULATORY_CARE_PROVIDER_SITE_OTHER): Payer: Self-pay | Admitting: Family Medicine

## 2018-09-07 DIAGNOSIS — G4709 Other insomnia: Secondary | ICD-10-CM

## 2018-10-13 ENCOUNTER — Other Ambulatory Visit (INDEPENDENT_AMBULATORY_CARE_PROVIDER_SITE_OTHER): Payer: Self-pay | Admitting: Family Medicine

## 2018-10-18 ENCOUNTER — Encounter: Payer: Self-pay | Admitting: Family Medicine

## 2018-10-18 MED ORDER — ALPRAZOLAM 0.5 MG PO TABS: 0.5000 mg | ORAL_TABLET | Freq: Every evening | ORAL | 3 refills | Status: DC | PRN

## 2018-11-25 ENCOUNTER — Ambulatory Visit: Payer: Self-pay | Admitting: Family Medicine

## 2018-12-16 ENCOUNTER — Encounter: Payer: Self-pay | Admitting: Family Medicine

## 2018-12-16 ENCOUNTER — Other Ambulatory Visit: Payer: Self-pay

## 2018-12-16 ENCOUNTER — Ambulatory Visit (INDEPENDENT_AMBULATORY_CARE_PROVIDER_SITE_OTHER): Payer: No Typology Code available for payment source | Admitting: Family Medicine

## 2018-12-16 VITALS — BP 152/82 | HR 57 | Ht 64.0 in | Wt 160.2 lb

## 2018-12-16 DIAGNOSIS — D649 Anemia, unspecified: Secondary | ICD-10-CM | POA: Diagnosis not present

## 2018-12-16 DIAGNOSIS — R739 Hyperglycemia, unspecified: Secondary | ICD-10-CM

## 2018-12-16 DIAGNOSIS — R030 Elevated blood-pressure reading, without diagnosis of hypertension: Secondary | ICD-10-CM | POA: Diagnosis not present

## 2018-12-16 MED ORDER — ACETAZOLAMIDE 125 MG PO TABS: ORAL_TABLET | ORAL | 0 refills | Status: DC

## 2018-12-16 MED ORDER — ALPRAZOLAM 0.5 MG PO TABS: 0.5000 mg | ORAL_TABLET | Freq: Every evening | ORAL | 3 refills | Status: DC | PRN
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
## 2018-12-17 LAB — CBC WITH DIFFERENTIAL/PLATELET
Absolute Monocytes: 683 cells/uL (ref 200–950)
Basophils Absolute: 60 cells/uL (ref 0–200)
Basophils Relative: 0.8 %
Eosinophils Absolute: 158 cells/uL (ref 15–500)
Eosinophils Relative: 2.1 %
HCT: 33.8 % — ABNORMAL LOW (ref 35.0–45.0)
Hemoglobin: 9.9 g/dL — ABNORMAL LOW (ref 11.7–15.5)
Lymphs Abs: 1898 cells/uL (ref 850–3900)
MCH: 20.5 pg — ABNORMAL LOW (ref 27.0–33.0)
MCHC: 29.3 g/dL — ABNORMAL LOW (ref 32.0–36.0)
MCV: 70.1 fL — ABNORMAL LOW (ref 80.0–100.0)
MPV: 10.2 fL (ref 7.5–12.5)
Monocytes Relative: 9.1 %
Neutro Abs: 4703 cells/uL (ref 1500–7800)
Neutrophils Relative %: 62.7 %
Platelets: 334 10*3/uL (ref 140–400)
RBC: 4.82 10*6/uL (ref 3.80–5.10)
RDW: 15.7 % — ABNORMAL HIGH (ref 11.0–15.0)
Total Lymphocyte: 25.3 %
WBC: 7.5 10*3/uL (ref 3.8–10.8)

## 2018-12-17 LAB — IRON,TIBC AND FERRITIN PANEL
%SAT: 12 % (calc) — ABNORMAL LOW (ref 16–45)
Ferritin: 5 ng/mL — ABNORMAL LOW (ref 16–232)
Iron: 46 ug/dL (ref 45–160)
TIBC: 383 mcg/dL (calc) (ref 250–450)

## 2018-12-17 LAB — BASIC METABOLIC PANEL
BUN: 12 mg/dL (ref 7–25)
CO2: 29 mmol/L (ref 20–32)
Calcium: 9.8 mg/dL (ref 8.6–10.4)
Chloride: 101 mmol/L (ref 98–110)
Creat: 0.94 mg/dL (ref 0.50–0.99)
Glucose, Bld: 82 mg/dL (ref 65–99)
Potassium: 3.4 mmol/L — ABNORMAL LOW (ref 3.5–5.3)
Sodium: 141 mmol/L (ref 135–146)

## 2018-12-17 LAB — HEMOGLOBIN A1C
Hgb A1c MFr Bld: 5.9 % of total Hgb — ABNORMAL HIGH (ref <5.7)
Mean Plasma Glucose: 123 (calc)
eAG (mmol/L): 6.8 (calc)

## 2018-12-19 ENCOUNTER — Telehealth: Payer: Self-pay | Admitting: Family Medicine

## 2018-12-19 NOTE — Telephone Encounter (Signed)
Ferritin is very low at 5 and hemoglobin low at 9.9.  Glucose looks good, but A1C is borderline.  Will recheck in 6 months.

## 2019-01-05 ENCOUNTER — Encounter: Payer: Self-pay | Admitting: Family Medicine

## 2019-01-05 MED ORDER — ACETAZOLAMIDE 125 MG PO TABS: ORAL_TABLET | ORAL | 3 refills | Status: DC
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
## 2019-03-24 ENCOUNTER — Ambulatory Visit: Payer: No Typology Code available for payment source | Admitting: Family Medicine

## 2019-03-30 ENCOUNTER — Encounter: Payer: Self-pay | Admitting: Family Medicine

## 2019-03-30 ENCOUNTER — Ambulatory Visit (INDEPENDENT_AMBULATORY_CARE_PROVIDER_SITE_OTHER): Payer: No Typology Code available for payment source | Admitting: Family Medicine

## 2019-03-30 ENCOUNTER — Other Ambulatory Visit: Payer: Self-pay

## 2019-03-30 VITALS — BP 119/75 | HR 70

## 2019-03-30 DIAGNOSIS — D649 Anemia, unspecified: Secondary | ICD-10-CM

## 2019-03-30 DIAGNOSIS — R030 Elevated blood-pressure reading, without diagnosis of hypertension: Secondary | ICD-10-CM | POA: Diagnosis not present

## 2019-03-30 MED ORDER — ALPRAZOLAM 0.5 MG PO TABS: 0.5000 mg | ORAL_TABLET | Freq: Every evening | ORAL | 3 refills | Status: DC | PRN
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
## 2019-03-31 ENCOUNTER — Telehealth: Payer: Self-pay | Admitting: Family Medicine

## 2019-03-31 LAB — CBC WITH DIFFERENTIAL/PLATELET
Absolute Monocytes: 588 cells/uL (ref 200–950)
Basophils Absolute: 70 cells/uL (ref 0–200)
Basophils Relative: 1 %
Eosinophils Absolute: 119 cells/uL (ref 15–500)
Eosinophils Relative: 1.7 %
HCT: 34.3 % — ABNORMAL LOW (ref 35.0–45.0)
Hemoglobin: 10.5 g/dL — ABNORMAL LOW (ref 11.7–15.5)
Lymphs Abs: 1260 cells/uL (ref 850–3900)
MCH: 22.4 pg — ABNORMAL LOW (ref 27.0–33.0)
MCHC: 30.6 g/dL — ABNORMAL LOW (ref 32.0–36.0)
MCV: 73.1 fL — ABNORMAL LOW (ref 80.0–100.0)
MPV: 10.3 fL (ref 7.5–12.5)
Monocytes Relative: 8.4 %
Neutro Abs: 4963 cells/uL (ref 1500–7800)
Neutrophils Relative %: 70.9 %
Platelets: 329 10*3/uL (ref 140–400)
RBC: 4.69 10*6/uL (ref 3.80–5.10)
RDW: 14.6 % (ref 11.0–15.0)
Total Lymphocyte: 18 %
WBC: 7 10*3/uL (ref 3.8–10.8)

## 2019-03-31 LAB — IRON,TIBC AND FERRITIN PANEL
%SAT: 8 % (calc) — ABNORMAL LOW (ref 16–45)
Ferritin: 4 ng/mL — ABNORMAL LOW (ref 16–232)
Iron: 29 ug/dL — ABNORMAL LOW (ref 45–160)
TIBC: 374 mcg/dL (calc) (ref 250–450)

## 2019-03-31 LAB — BASIC METABOLIC PANEL
BUN: 13 mg/dL (ref 7–25)
CO2: 25 mmol/L (ref 20–32)
Calcium: 9.5 mg/dL (ref 8.6–10.4)
Chloride: 106 mmol/L (ref 98–110)
Creat: 0.99 mg/dL (ref 0.50–0.99)
Glucose, Bld: 100 mg/dL — ABNORMAL HIGH (ref 65–99)
Potassium: 3.7 mmol/L (ref 3.5–5.3)
Sodium: 141 mmol/L (ref 135–146)

## 2019-03-31 NOTE — Telephone Encounter (Signed)
Labs show:  Blood glucose is borderline at 100.  He remains important to follow a diabetic diet and to exercise regularly.  Recheck in about 3 months along with A1c.  Iron studies have not improved.

## 2019-05-17 ENCOUNTER — Other Ambulatory Visit: Payer: Self-pay | Admitting: Family Medicine

## 2019-05-18 ENCOUNTER — Encounter: Payer: Self-pay | Admitting: Family Medicine

## 2019-06-06 ENCOUNTER — Other Ambulatory Visit (INDEPENDENT_AMBULATORY_CARE_PROVIDER_SITE_OTHER): Payer: Self-pay | Admitting: Family Medicine

## 2019-06-06 DIAGNOSIS — G4709 Other insomnia: Secondary | ICD-10-CM

## 2019-06-07 ENCOUNTER — Encounter: Payer: Self-pay | Admitting: Family Medicine

## 2019-06-29 ENCOUNTER — Ambulatory Visit: Payer: PRIVATE HEALTH INSURANCE | Attending: Internal Medicine

## 2019-06-29 DIAGNOSIS — Z23 Encounter for immunization: Secondary | ICD-10-CM | POA: Insufficient documentation

## 2019-06-29 NOTE — Progress Notes (Signed)
   Covid-19 Vaccination Clinic  Name:  Lori Summers    MRN: [id number] DOB: January 01, 1961  06/29/2019  Lori Summers was observed post Covid-19 immunization for 15 minutes without incidence. She was provided with Vaccine Information Sheet and instruction to access the V-Safe system.   Lori Summers was instructed to call 911 with any severe reactions post vaccine: Marland Kitchen Difficulty breathing  . Swelling of your face and throat  . A fast heartbeat  . A bad rash all over your body  . Dizziness and weakness    Immunizations Administered    Name Date Dose VIS Date Route   Pfizer COVID-19 Vaccine 06/29/2019  1:22 PM 0.3 mL 04/28/2019 Intramuscular   Manufacturer: [employer], [employer]   Lot: [id number]   NDC: [address]-5

## 2019-07-19 ENCOUNTER — Encounter: Payer: Self-pay | Admitting: Family Medicine

## 2019-07-19 MED ORDER — ALPRAZOLAM 0.5 MG PO TABS: 0.5000 mg | ORAL_TABLET | Freq: Every evening | ORAL | 1 refills | Status: DC | PRN

## 2019-07-22 ENCOUNTER — Ambulatory Visit: Payer: No Typology Code available for payment source

## 2019-07-25 ENCOUNTER — Ambulatory Visit: Payer: PRIVATE HEALTH INSURANCE | Attending: Internal Medicine

## 2019-07-25 DIAGNOSIS — Z23 Encounter for immunization: Secondary | ICD-10-CM | POA: Insufficient documentation

## 2019-07-25 NOTE — Progress Notes (Signed)
   Covid-19 Vaccination Clinic  Name:  Maylee Bare    MRN: [id number] DOB: 1961-04-13  07/25/2019  Ms. Christon was observed post Covid-19 immunization for 15 minutes without incident. She was provided with Vaccine Information Sheet and instruction to access the V-Safe system.   Ms. Serafin was instructed to call 911 with any severe reactions post vaccine: Marland Kitchen Difficulty breathing  . Swelling of face and throat  . A fast heartbeat  . A bad rash all over body  . Dizziness and weakness   Immunizations Administered    Name Date Dose VIS Date Route   Pfizer COVID-19 Vaccine 07/25/2019  5:40 PM 0.3 mL 04/28/2019 Intramuscular   Manufacturer: [employer], [employer]   Lot: [id number]   NDC: [address]-4

## 2019-09-06 ENCOUNTER — Encounter: Payer: Self-pay | Admitting: Family Medicine

## 2019-10-12 ENCOUNTER — Other Ambulatory Visit: Payer: Self-pay | Admitting: Family Medicine

## 2019-11-21 ENCOUNTER — Other Ambulatory Visit: Payer: Self-pay | Admitting: Family Medicine

## 2020-02-19 ENCOUNTER — Encounter: Payer: Self-pay | Admitting: Family Medicine

## 2020-02-19 ENCOUNTER — Ambulatory Visit (INDEPENDENT_AMBULATORY_CARE_PROVIDER_SITE_OTHER): Payer: PRIVATE HEALTH INSURANCE | Admitting: Family Medicine

## 2020-02-19 ENCOUNTER — Other Ambulatory Visit: Payer: Self-pay

## 2020-02-19 VITALS — BP 125/72 | HR 90

## 2020-02-19 DIAGNOSIS — D649 Anemia, unspecified: Secondary | ICD-10-CM

## 2020-02-19 DIAGNOSIS — R030 Elevated blood-pressure reading, without diagnosis of hypertension: Secondary | ICD-10-CM | POA: Diagnosis not present

## 2020-02-19 DIAGNOSIS — F418 Other specified anxiety disorders: Secondary | ICD-10-CM

## 2020-02-19 DIAGNOSIS — R739 Hyperglycemia, unspecified: Secondary | ICD-10-CM | POA: Diagnosis not present

## 2020-02-19 MED ORDER — ALPRAZOLAM 0.5 MG PO TABS: 0.5000 mg | ORAL_TABLET | Freq: Every evening | ORAL | 1 refills | Status: DC | PRN
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
## 2020-02-20 ENCOUNTER — Telehealth: Payer: Self-pay | Admitting: Family Medicine

## 2020-02-20 LAB — CBC WITH DIFFERENTIAL/PLATELET
Absolute Monocytes: 549 cells/uL (ref 200–950)
Basophils Absolute: 83 cells/uL (ref 0–200)
Basophils Relative: 1.4 %
Eosinophils Absolute: 148 cells/uL (ref 15–500)
Eosinophils Relative: 2.5 %
HCT: 34.1 % — ABNORMAL LOW (ref 35.0–45.0)
Hemoglobin: 10.4 g/dL — ABNORMAL LOW (ref 11.7–15.5)
Lymphs Abs: 1151 cells/uL (ref 850–3900)
MCH: 21.4 pg — ABNORMAL LOW (ref 27.0–33.0)
MCHC: 30.5 g/dL — ABNORMAL LOW (ref 32.0–36.0)
MCV: 70.2 fL — ABNORMAL LOW (ref 80.0–100.0)
MPV: 10.3 fL (ref 7.5–12.5)
Monocytes Relative: 9.3 %
Neutro Abs: 3971 cells/uL (ref 1500–7800)
Neutrophils Relative %: 67.3 %
Platelets: 274 10*3/uL (ref 140–400)
RBC: 4.86 10*6/uL (ref 3.80–5.10)
RDW: 14.9 % (ref 11.0–15.0)
Total Lymphocyte: 19.5 %
WBC: 5.9 10*3/uL (ref 3.8–10.8)

## 2020-02-20 LAB — HEMOGLOBIN A1C
Hgb A1c MFr Bld: 6.1 % of total Hgb — ABNORMAL HIGH (ref <5.7)
Mean Plasma Glucose: 128 (calc)
eAG (mmol/L): 7.1 (calc)

## 2020-02-20 LAB — COMPREHENSIVE METABOLIC PANEL
AG Ratio: 1.3 (calc) (ref 1.0–2.5)
ALT: 11 U/L (ref 6–29)
AST: 14 U/L (ref 10–35)
Albumin: 3.6 g/dL (ref 3.6–5.1)
Alkaline phosphatase (APISO): 71 U/L (ref 37–153)
BUN: 15 mg/dL (ref 7–25)
CO2: 26 mmol/L (ref 20–32)
Calcium: 9.3 mg/dL (ref 8.6–10.4)
Chloride: 105 mmol/L (ref 98–110)
Creat: 0.82 mg/dL (ref 0.50–0.99)
Globulin: 2.7 g/dL (calc) (ref 1.9–3.7)
Glucose, Bld: 109 mg/dL — ABNORMAL HIGH (ref 65–99)
Potassium: 4 mmol/L (ref 3.5–5.3)
Sodium: 140 mmol/L (ref 135–146)
Total Bilirubin: 0.4 mg/dL (ref 0.2–1.2)
Total Protein: 6.3 g/dL (ref 6.1–8.1)

## 2020-02-20 LAB — IRON,TIBC AND FERRITIN PANEL
%SAT: 6 % (calc) — ABNORMAL LOW (ref 16–45)
Ferritin: 4 ng/mL — ABNORMAL LOW (ref 16–288)
Iron: 22 ug/dL — ABNORMAL LOW (ref 45–160)
TIBC: 398 mcg/dL (calc) (ref 250–450)

## 2020-02-20 NOTE — Telephone Encounter (Signed)
Blood glucose and A1C are slightly worse.  Anemia still present, about the same.

## 2020-03-11 ENCOUNTER — Other Ambulatory Visit (INDEPENDENT_AMBULATORY_CARE_PROVIDER_SITE_OTHER): Payer: Self-pay | Admitting: Family Medicine

## 2020-03-11 DIAGNOSIS — G4709 Other insomnia: Secondary | ICD-10-CM

## 2020-03-22 ENCOUNTER — Encounter: Payer: Self-pay | Admitting: Family Medicine

## 2020-03-22 MED ORDER — ALPRAZOLAM 0.5 MG PO TABS: 0.5000 mg | ORAL_TABLET | Freq: Every evening | ORAL | 1 refills | Status: DC | PRN

## 2020-05-21 ENCOUNTER — Encounter: Payer: Self-pay | Admitting: Family Medicine

## 2020-05-21 MED ORDER — ALPRAZOLAM 0.5 MG PO TABS: 0.5000 mg | ORAL_TABLET | Freq: Every evening | ORAL | 1 refills | Status: DC | PRN

## 2020-07-22 ENCOUNTER — Encounter: Payer: Self-pay | Admitting: Family Medicine

## 2020-07-22 MED ORDER — ALPRAZOLAM 0.5 MG PO TABS: 0.5000 mg | ORAL_TABLET | Freq: Every evening | ORAL | 1 refills | Status: DC | PRN

## 2020-09-21 ENCOUNTER — Other Ambulatory Visit: Payer: Self-pay | Admitting: Family Medicine

## 2020-10-18 ENCOUNTER — Encounter: Payer: Self-pay | Admitting: Family Medicine

## 2020-10-18 ENCOUNTER — Ambulatory Visit: Payer: PRIVATE HEALTH INSURANCE | Admitting: Family Medicine

## 2020-10-18 ENCOUNTER — Other Ambulatory Visit: Payer: Self-pay

## 2020-10-18 VITALS — BP 121/75 | HR 69 | Ht 64.0 in | Wt 151.0 lb

## 2020-10-18 DIAGNOSIS — D649 Anemia, unspecified: Secondary | ICD-10-CM | POA: Diagnosis not present

## 2020-10-18 DIAGNOSIS — R739 Hyperglycemia, unspecified: Secondary | ICD-10-CM | POA: Diagnosis not present

## 2020-10-18 DIAGNOSIS — R030 Elevated blood-pressure reading, without diagnosis of hypertension: Secondary | ICD-10-CM
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
## 2020-10-19 LAB — COMPREHENSIVE METABOLIC PANEL
AG Ratio: 1.3 (calc) (ref 1.0–2.5)
ALT: 13 U/L (ref 6–29)
AST: 16 U/L (ref 10–35)
Albumin: 3.8 g/dL (ref 3.6–5.1)
Alkaline phosphatase (APISO): 72 U/L (ref 37–153)
BUN: 12 mg/dL (ref 7–25)
CO2: 27 mmol/L (ref 20–32)
Calcium: 9.8 mg/dL (ref 8.6–10.4)
Chloride: 103 mmol/L (ref 98–110)
Creat: 0.92 mg/dL (ref 0.50–0.99)
Globulin: 2.9 g/dL (calc) (ref 1.9–3.7)
Glucose, Bld: 96 mg/dL (ref 65–99)
Potassium: 4.3 mmol/L (ref 3.5–5.3)
Sodium: 140 mmol/L (ref 135–146)
Total Bilirubin: 0.4 mg/dL (ref 0.2–1.2)
Total Protein: 6.7 g/dL (ref 6.1–8.1)

## 2020-10-19 LAB — CBC WITH DIFFERENTIAL/PLATELET
Absolute Monocytes: 535 cells/uL (ref 200–950)
Basophils Absolute: 58 cells/uL (ref 0–200)
Basophils Relative: 1.1 %
Eosinophils Absolute: 90 cells/uL (ref 15–500)
Eosinophils Relative: 1.7 %
HCT: 39.2 % (ref 35.0–45.0)
Hemoglobin: 11.7 g/dL (ref 11.7–15.5)
Lymphs Abs: 1261 cells/uL (ref 850–3900)
MCH: 21.6 pg — ABNORMAL LOW (ref 27.0–33.0)
MCHC: 29.8 g/dL — ABNORMAL LOW (ref 32.0–36.0)
MCV: 72.3 fL — ABNORMAL LOW (ref 80.0–100.0)
MPV: 10.1 fL (ref 7.5–12.5)
Monocytes Relative: 10.1 %
Neutro Abs: 3355 cells/uL (ref 1500–7800)
Neutrophils Relative %: 63.3 %
Platelets: 338 10*3/uL (ref 140–400)
RBC: 5.42 10*6/uL — ABNORMAL HIGH (ref 3.80–5.10)
RDW: 16.3 % — ABNORMAL HIGH (ref 11.0–15.0)
Total Lymphocyte: 23.8 %
WBC: 5.3 10*3/uL (ref 3.8–10.8)

## 2020-10-19 LAB — IRON,TIBC AND FERRITIN PANEL
%SAT: 6 % (calc) — ABNORMAL LOW (ref 16–45)
Ferritin: 4 ng/mL — ABNORMAL LOW (ref 16–288)
Iron: 22 ug/dL — ABNORMAL LOW (ref 45–160)
TIBC: 362 mcg/dL (calc) (ref 250–450)

## 2020-10-19 LAB — HEMOGLOBIN A1C
Hgb A1c MFr Bld: 5.8 % of total Hgb — ABNORMAL HIGH (ref <5.7)
Mean Plasma Glucose: 120 mg/dL
eAG (mmol/L): 6.6 mmol/L

## 2020-10-21 ENCOUNTER — Telehealth: Payer: Self-pay | Admitting: Family Medicine

## 2020-10-21 DIAGNOSIS — D509 Iron deficiency anemia, unspecified: Secondary | ICD-10-CM

## 2020-10-21 DIAGNOSIS — D649 Anemia, unspecified: Secondary | ICD-10-CM

## 2020-10-21 NOTE — Telephone Encounter (Signed)
Labs are notable for the following:  A1c looks better at 5.8.  Recheck in about 6 months.  Hemoglobin looks better at 11.7, as well as hematocrit at 39.2.  Iron studies still show deficiency with iron of 22 and ferritin of 4.

## 2020-10-21 NOTE — Addendum Note (Signed)
Addended by: Lillia Carmel on: 10/21/2020 09:27 AM   Modules accepted: Orders

## 2020-10-23 ENCOUNTER — Telehealth: Payer: Self-pay | Admitting: Hematology and Oncology

## 2020-10-23 NOTE — Telephone Encounter (Signed)
Scheduled appt per 6/6 referral. Pt aware of appt.

## 2020-10-24 ENCOUNTER — Other Ambulatory Visit: Payer: Self-pay

## 2020-10-24 ENCOUNTER — Inpatient Hospital Stay: Payer: No Typology Code available for payment source

## 2020-10-24 ENCOUNTER — Inpatient Hospital Stay
Payer: No Typology Code available for payment source | Attending: Hematology and Oncology | Admitting: Hematology and Oncology

## 2020-10-24 VITALS — BP 149/99 | HR 75 | Temp 97.8°F | Resp 17 | Ht 64.0 in | Wt 151.8 lb

## 2020-10-24 DIAGNOSIS — E119 Type 2 diabetes mellitus without complications: Secondary | ICD-10-CM | POA: Insufficient documentation

## 2020-10-24 DIAGNOSIS — D508 Other iron deficiency anemias: Secondary | ICD-10-CM | POA: Diagnosis not present

## 2020-10-24 DIAGNOSIS — Z9884 Bariatric surgery status: Secondary | ICD-10-CM | POA: Insufficient documentation

## 2020-10-24 DIAGNOSIS — I10 Essential (primary) hypertension: Secondary | ICD-10-CM | POA: Insufficient documentation

## 2020-10-24 DIAGNOSIS — D509 Iron deficiency anemia, unspecified: Secondary | ICD-10-CM | POA: Diagnosis not present

## 2020-10-24 DIAGNOSIS — Z79899 Other long term (current) drug therapy: Secondary | ICD-10-CM | POA: Diagnosis not present

## 2020-10-24 LAB — CMP (CANCER CENTER ONLY)
ALT: 11 U/L (ref 0–44)
AST: 16 U/L (ref 15–41)
Albumin: 3.7 g/dL (ref 3.5–5.0)
Alkaline Phosphatase: 83 U/L (ref 38–126)
Anion gap: 10 (ref 5–15)
BUN: 15 mg/dL (ref 8–23)
CO2: 28 mmol/L (ref 22–32)
Calcium: 9.7 mg/dL (ref 8.9–10.3)
Chloride: 101 mmol/L (ref 98–111)
Creatinine: 0.78 mg/dL (ref 0.44–1.00)
GFR, Estimated: >60 mL/min (ref >60)
Glucose, Bld: 100 mg/dL — ABNORMAL HIGH (ref 70–99)
Potassium: 3.6 mmol/L (ref 3.5–5.1)
Sodium: 139 mmol/L (ref 135–145)
Total Bilirubin: 0.3 mg/dL (ref 0.3–1.2)
Total Protein: 7.4 g/dL (ref 6.5–8.1)

## 2020-10-24 LAB — CBC WITH DIFFERENTIAL (CANCER CENTER ONLY)
Abs Immature Granulocytes: 0.02 10*3/uL (ref 0.00–0.07)
Basophils Absolute: 0.1 10*3/uL (ref 0.0–0.1)
Basophils Relative: 1 %
Eosinophils Absolute: 0.1 10*3/uL (ref 0.0–0.5)
Eosinophils Relative: 2 %
HCT: 35.2 % — ABNORMAL LOW (ref 36.0–46.0)
Hemoglobin: 11.2 g/dL — ABNORMAL LOW (ref 12.0–15.0)
Immature Granulocytes: 0 %
Lymphocytes Relative: 25 %
Lymphs Abs: 1.9 10*3/uL (ref 0.7–4.0)
MCH: 22.2 pg — ABNORMAL LOW (ref 26.0–34.0)
MCHC: 31.8 g/dL (ref 30.0–36.0)
MCV: 69.7 fL — ABNORMAL LOW (ref 80.0–100.0)
Monocytes Absolute: 0.8 10*3/uL (ref 0.1–1.0)
Monocytes Relative: 11 %
Neutro Abs: 4.7 10*3/uL (ref 1.7–7.7)
Neutrophils Relative %: 61 %
Platelet Count: 298 10*3/uL (ref 150–400)
RBC: 5.05 MIL/uL (ref 3.87–5.11)
RDW: 17.7 % — ABNORMAL HIGH (ref 11.5–15.5)
WBC Count: 7.6 10*3/uL (ref 4.0–10.5)
nRBC: 0 % (ref 0.0–0.2)

## 2020-10-24 LAB — IRON AND TIBC
Iron: 14 ug/dL — ABNORMAL LOW (ref 41–142)
Saturation Ratios: 4 % — ABNORMAL LOW (ref 21–57)
TIBC: 374 ug/dL (ref 236–444)
UIBC: 360 ug/dL (ref 120–384)

## 2020-10-24 LAB — RETIC PANEL
Immature Retic Fract: 8.2 % (ref 2.3–15.9)
RBC.: 5.04 MIL/uL (ref 3.87–5.11)
Retic Count, Absolute: 37.8 10*3/uL (ref 19.0–186.0)
Retic Ct Pct: 0.8 % (ref 0.4–3.1)
Reticulocyte Hemoglobin: 31.4 pg (ref >27.9)

## 2020-10-24 LAB — LACTATE DEHYDROGENASE: LDH: 144 U/L (ref 98–192)

## 2020-10-24 LAB — FERRITIN: Ferritin: 8 ng/mL — ABNORMAL LOW (ref 11–307)

## 2020-10-24 LAB — VITAMIN B12: Vitamin B-12: 702 pg/mL (ref 180–914)

## 2020-10-24 LAB — FOLATE: Folate: 34.9 ng/mL (ref >5.9)
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
## 2020-10-25 ENCOUNTER — Other Ambulatory Visit: Payer: Self-pay | Admitting: Hematology and Oncology

## 2020-10-29 ENCOUNTER — Other Ambulatory Visit: Payer: Self-pay | Admitting: Hematology and Oncology

## 2020-10-31 ENCOUNTER — Other Ambulatory Visit (INDEPENDENT_AMBULATORY_CARE_PROVIDER_SITE_OTHER): Payer: Self-pay | Admitting: Family Medicine

## 2020-10-31 DIAGNOSIS — G4709 Other insomnia: Secondary | ICD-10-CM

## 2020-10-31 MED ORDER — TRAZODONE HCL 100 MG PO TABS: 100.0000 mg | ORAL_TABLET | Freq: Every evening | ORAL | 2 refills | Status: DC | PRN

## 2020-11-01 ENCOUNTER — Telehealth: Payer: Self-pay | Admitting: Hematology and Oncology

## 2020-11-01 NOTE — Telephone Encounter (Signed)
Sch per 6/16 sch msg, left message 

## 2020-11-05 NOTE — Progress Notes (Signed)
Intravenous Iron Formulation Change  Lori Summers has insurance that requires a change in intravenous iron product from Feraheme to Venofer. Orders have been updated to reflect this change and scheduling message sent to adjust infusion appointments. Dr Leonides Schanz notified and agrees with the plan.  Allergies: No Known Allergies  The plan for iron therapy is as follows: Venofer 200 mg IVPB x 5 doses  Lori Summers 11/05/2020

## 2020-11-06 ENCOUNTER — Encounter: Payer: Self-pay | Admitting: Hematology and Oncology

## 2020-11-06 ENCOUNTER — Other Ambulatory Visit: Payer: Self-pay | Admitting: Hematology and Oncology

## 2020-11-06 ENCOUNTER — Other Ambulatory Visit: Payer: PRIVATE HEALTH INSURANCE

## 2020-11-06 ENCOUNTER — Inpatient Hospital Stay: Payer: No Typology Code available for payment source

## 2020-11-06 ENCOUNTER — Other Ambulatory Visit: Payer: Self-pay

## 2020-11-06 VITALS — BP 135/73 | HR 67 | Temp 98.3°F | Resp 18

## 2020-11-06 DIAGNOSIS — D509 Iron deficiency anemia, unspecified: Secondary | ICD-10-CM | POA: Diagnosis not present

## 2020-11-06 DIAGNOSIS — D508 Other iron deficiency anemias: Secondary | ICD-10-CM

## 2020-11-06 MED ORDER — SODIUM CHLORIDE 0.9 % IV SOLN
200.0000 mg | Freq: Once | INTRAVENOUS | Status: AC
  Administered 2020-11-06: 200 mg via INTRAVENOUS
  Filled 2020-11-06: qty 200

## 2020-11-06 MED ORDER — SODIUM CHLORIDE 0.9 % IV SOLN
Freq: Once | INTRAVENOUS | Status: AC
  Filled 2020-11-06: qty 250

## 2020-11-06 NOTE — Patient Instructions (Signed)

## 2020-11-13 ENCOUNTER — Other Ambulatory Visit: Payer: PRIVATE HEALTH INSURANCE

## 2020-11-13 ENCOUNTER — Other Ambulatory Visit: Payer: Self-pay

## 2020-11-13 ENCOUNTER — Inpatient Hospital Stay: Payer: No Typology Code available for payment source

## 2020-11-13 ENCOUNTER — Ambulatory Visit: Payer: PRIVATE HEALTH INSURANCE | Admitting: Hematology and Oncology

## 2020-11-13 VITALS — BP 129/67 | HR 60 | Temp 97.9°F | Resp 17

## 2020-11-13 DIAGNOSIS — D509 Iron deficiency anemia, unspecified: Secondary | ICD-10-CM | POA: Diagnosis not present

## 2020-11-13 DIAGNOSIS — D508 Other iron deficiency anemias: Secondary | ICD-10-CM

## 2020-11-13 MED ORDER — SODIUM CHLORIDE 0.9 % IV SOLN
200.0000 mg | Freq: Once | INTRAVENOUS | Status: AC
  Administered 2020-11-13: 200 mg via INTRAVENOUS
  Filled 2020-11-13: qty 200

## 2020-11-13 MED ORDER — SODIUM CHLORIDE 0.9 % IV SOLN
Freq: Once | INTRAVENOUS | Status: AC
  Filled 2020-11-13: qty 250

## 2020-11-13 NOTE — Patient Instructions (Signed)

## 2020-11-21 ENCOUNTER — Inpatient Hospital Stay: Payer: No Typology Code available for payment source | Attending: Hematology and Oncology

## 2020-11-21 ENCOUNTER — Other Ambulatory Visit: Payer: Self-pay

## 2020-11-21 ENCOUNTER — Encounter: Payer: Self-pay | Admitting: Family Medicine

## 2020-11-21 ENCOUNTER — Other Ambulatory Visit: Payer: Self-pay | Admitting: Family Medicine

## 2020-11-21 VITALS — BP 128/72 | HR 62 | Temp 98.2°F | Resp 18

## 2020-11-21 DIAGNOSIS — D509 Iron deficiency anemia, unspecified: Secondary | ICD-10-CM | POA: Diagnosis present

## 2020-11-21 DIAGNOSIS — D508 Other iron deficiency anemias: Secondary | ICD-10-CM

## 2020-11-21 MED ORDER — SODIUM CHLORIDE 0.9 % IV SOLN
200.0000 mg | Freq: Once | INTRAVENOUS | Status: AC
  Administered 2020-11-21: 200 mg via INTRAVENOUS
  Filled 2020-11-21: qty 200

## 2020-11-21 MED ORDER — SODIUM CHLORIDE 0.9 % IV SOLN
Freq: Once | INTRAVENOUS | Status: AC
  Filled 2020-11-21: qty 250

## 2020-11-21 NOTE — Patient Instructions (Signed)

## 2020-11-21 NOTE — Progress Notes (Signed)
Patient waited full 30 minute post iron observation with no issues vss upon discharge.

## 2020-11-27 ENCOUNTER — Inpatient Hospital Stay: Payer: No Typology Code available for payment source

## 2020-11-27 ENCOUNTER — Other Ambulatory Visit: Payer: Self-pay

## 2020-11-27 VITALS — BP 137/68 | HR 62 | Temp 98.1°F | Resp 17 | Wt 155.8 lb

## 2020-11-27 DIAGNOSIS — D508 Other iron deficiency anemias: Secondary | ICD-10-CM

## 2020-11-27 DIAGNOSIS — D509 Iron deficiency anemia, unspecified: Secondary | ICD-10-CM | POA: Diagnosis not present

## 2020-11-27 MED ORDER — SODIUM CHLORIDE 0.9 % IV SOLN
200.0000 mg | Freq: Once | INTRAVENOUS | Status: AC
  Administered 2020-11-27: 200 mg via INTRAVENOUS
  Filled 2020-11-27: qty 200

## 2020-11-27 MED ORDER — SODIUM CHLORIDE 0.9 % IV SOLN
Freq: Once | INTRAVENOUS | Status: AC
  Filled 2020-11-27: qty 250

## 2020-11-27 NOTE — Patient Instructions (Signed)

## 2020-12-04 ENCOUNTER — Inpatient Hospital Stay: Payer: No Typology Code available for payment source

## 2020-12-04 ENCOUNTER — Other Ambulatory Visit: Payer: Self-pay

## 2020-12-04 VITALS — BP 134/77 | HR 73 | Temp 98.2°F

## 2020-12-04 DIAGNOSIS — D508 Other iron deficiency anemias: Secondary | ICD-10-CM

## 2020-12-04 DIAGNOSIS — D509 Iron deficiency anemia, unspecified: Secondary | ICD-10-CM | POA: Diagnosis not present

## 2020-12-04 MED ORDER — SODIUM CHLORIDE 0.9 % IV SOLN
200.0000 mg | Freq: Once | INTRAVENOUS | Status: AC
  Administered 2020-12-04: 200 mg via INTRAVENOUS
  Filled 2020-12-04: qty 200

## 2020-12-04 MED ORDER — SODIUM CHLORIDE 0.9 % IV SOLN
Freq: Once | INTRAVENOUS | Status: AC
  Filled 2020-12-04: qty 250

## 2020-12-04 NOTE — Patient Instructions (Signed)

## 2020-12-06 ENCOUNTER — Other Ambulatory Visit: Payer: PRIVATE HEALTH INSURANCE

## 2020-12-06 ENCOUNTER — Other Ambulatory Visit (INDEPENDENT_AMBULATORY_CARE_PROVIDER_SITE_OTHER): Payer: Self-pay | Admitting: Family Medicine

## 2020-12-06 ENCOUNTER — Ambulatory Visit: Payer: PRIVATE HEALTH INSURANCE | Admitting: Hematology and Oncology

## 2020-12-06 DIAGNOSIS — G4709 Other insomnia: Secondary | ICD-10-CM

## 2020-12-25 ENCOUNTER — Other Ambulatory Visit: Payer: Self-pay

## 2020-12-25 ENCOUNTER — Inpatient Hospital Stay (HOSPITAL_BASED_OUTPATIENT_CLINIC_OR_DEPARTMENT_OTHER): Payer: No Typology Code available for payment source | Admitting: Hematology and Oncology

## 2020-12-25 ENCOUNTER — Inpatient Hospital Stay: Payer: No Typology Code available for payment source | Attending: Hematology and Oncology

## 2020-12-25 ENCOUNTER — Other Ambulatory Visit: Payer: Self-pay | Admitting: Hematology and Oncology

## 2020-12-25 VITALS — BP 130/81 | HR 68 | Temp 98.7°F | Resp 20 | Wt 156.1 lb

## 2020-12-25 DIAGNOSIS — I10 Essential (primary) hypertension: Secondary | ICD-10-CM | POA: Insufficient documentation

## 2020-12-25 DIAGNOSIS — D508 Other iron deficiency anemias: Secondary | ICD-10-CM

## 2020-12-25 DIAGNOSIS — Z808 Family history of malignant neoplasm of other organs or systems: Secondary | ICD-10-CM | POA: Diagnosis not present

## 2020-12-25 DIAGNOSIS — E119 Type 2 diabetes mellitus without complications: Secondary | ICD-10-CM | POA: Diagnosis not present

## 2020-12-25 DIAGNOSIS — Z9884 Bariatric surgery status: Secondary | ICD-10-CM | POA: Insufficient documentation

## 2020-12-25 DIAGNOSIS — D509 Iron deficiency anemia, unspecified: Secondary | ICD-10-CM | POA: Diagnosis present

## 2020-12-25 DIAGNOSIS — Z801 Family history of malignant neoplasm of trachea, bronchus and lung: Secondary | ICD-10-CM | POA: Insufficient documentation

## 2020-12-25 LAB — CMP (CANCER CENTER ONLY)
ALT: 14 U/L (ref 0–44)
AST: 15 U/L (ref 15–41)
Albumin: 3.3 g/dL — ABNORMAL LOW (ref 3.5–5.0)
Alkaline Phosphatase: 81 U/L (ref 38–126)
Anion gap: 7 (ref 5–15)
BUN: 8 mg/dL (ref 8–23)
CO2: 25 mmol/L (ref 22–32)
Calcium: 9.1 mg/dL (ref 8.9–10.3)
Chloride: 107 mmol/L (ref 98–111)
Creatinine: 0.76 mg/dL (ref 0.44–1.00)
GFR, Estimated: >60 mL/min (ref >60)
Glucose, Bld: 84 mg/dL (ref 70–99)
Potassium: 4.1 mmol/L (ref 3.5–5.1)
Sodium: 139 mmol/L (ref 135–145)
Total Bilirubin: <0.2 mg/dL — ABNORMAL LOW (ref 0.3–1.2)
Total Protein: 6.4 g/dL — ABNORMAL LOW (ref 6.5–8.1)

## 2020-12-25 LAB — FERRITIN: Ferritin: 66 ng/mL (ref 11–307)

## 2020-12-25 LAB — RETIC PANEL
Immature Retic Fract: 6.6 % (ref 2.3–15.9)
RBC.: 4.63 MIL/uL (ref 3.87–5.11)
Retic Count, Absolute: 29.2 10*3/uL (ref 19.0–186.0)
Retic Ct Pct: 0.6 % (ref 0.4–3.1)
Reticulocyte Hemoglobin: 33.6 pg (ref >27.9)

## 2020-12-25 LAB — CBC WITH DIFFERENTIAL (CANCER CENTER ONLY)
Abs Immature Granulocytes: 0.01 10*3/uL (ref 0.00–0.07)
Basophils Absolute: 0.1 10*3/uL (ref 0.0–0.1)
Basophils Relative: 1 %
Eosinophils Absolute: 0.1 10*3/uL (ref 0.0–0.5)
Eosinophils Relative: 2 %
HCT: 36.8 % (ref 36.0–46.0)
Hemoglobin: 12.2 g/dL (ref 12.0–15.0)
Immature Granulocytes: 0 %
Lymphocytes Relative: 24 %
Lymphs Abs: 1.8 10*3/uL (ref 0.7–4.0)
MCH: 26.2 pg (ref 26.0–34.0)
MCHC: 33.2 g/dL (ref 30.0–36.0)
MCV: 79.1 fL — ABNORMAL LOW (ref 80.0–100.0)
Monocytes Absolute: 0.7 10*3/uL (ref 0.1–1.0)
Monocytes Relative: 10 %
Neutro Abs: 4.7 10*3/uL (ref 1.7–7.7)
Neutrophils Relative %: 63 %
Platelet Count: 245 10*3/uL (ref 150–400)
RBC: 4.65 MIL/uL (ref 3.87–5.11)
RDW: 22.2 % — ABNORMAL HIGH (ref 11.5–15.5)
WBC Count: 7.3 10*3/uL (ref 4.0–10.5)
nRBC: 0 % (ref 0.0–0.2)

## 2020-12-25 LAB — IRON AND TIBC
Iron: 69 ug/dL (ref 41–142)
Saturation Ratios: 26 % (ref 21–57)
TIBC: 266 ug/dL (ref 236–444)
UIBC: 197 ug/dL (ref 120–384)
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
## 2020-12-30 ENCOUNTER — Telehealth: Payer: Self-pay | Admitting: Hematology and Oncology

## 2020-12-30 NOTE — Telephone Encounter (Signed)
Scheduled per los. Called and spoke with patient. Confirmed appt 

## 2021-01-09 ENCOUNTER — Telehealth: Payer: Self-pay | Admitting: *Deleted

## 2021-01-09 NOTE — Telephone Encounter (Signed)
-----   Message from Jaci Standard, MD sent at 01/07/2021  8:36 AM EDT ----- Please let Lori Summers know that her Hgb, ferritin, and other iron labs are at target. We will see her back in Nov 2022 to assure her levels remain strong  ----- Message ----- From: Leory Plowman, Lab In [place] Sent: 12/25/2020   2:07 PM EDT To: Jaci Standard, MD

## 2021-01-09 NOTE — Telephone Encounter (Signed)
TCT patient regarding recent lab results. No answer. LVM message for pt to return call to [phone] at her earliest convenience.

## 2021-03-28 ENCOUNTER — Inpatient Hospital Stay: Payer: No Typology Code available for payment source | Admitting: Hematology and Oncology

## 2021-03-28 ENCOUNTER — Inpatient Hospital Stay: Payer: No Typology Code available for payment source

## 2022-01-12 ENCOUNTER — Ambulatory Visit: Payer: No Typology Code available for payment source | Admitting: Nurse Practitioner

## 2022-01-12 ENCOUNTER — Encounter: Payer: Self-pay | Admitting: Nurse Practitioner

## 2022-01-12 VITALS — BP 116/74 | HR 85 | Temp 97.4°F | Resp 14 | Ht 64.0 in | Wt 162.2 lb

## 2022-01-12 DIAGNOSIS — Z8639 Personal history of other endocrine, nutritional and metabolic disease: Secondary | ICD-10-CM

## 2022-01-12 DIAGNOSIS — Z0000 Encounter for general adult medical examination without abnormal findings: Secondary | ICD-10-CM

## 2022-01-12 DIAGNOSIS — Z1231 Encounter for screening mammogram for malignant neoplasm of breast: Secondary | ICD-10-CM | POA: Diagnosis not present

## 2022-01-12 DIAGNOSIS — D649 Anemia, unspecified: Secondary | ICD-10-CM | POA: Diagnosis not present

## 2022-01-12 DIAGNOSIS — Z9884 Bariatric surgery status: Secondary | ICD-10-CM

## 2022-01-12 DIAGNOSIS — F419 Anxiety disorder, unspecified: Secondary | ICD-10-CM | POA: Diagnosis not present

## 2022-01-12 DIAGNOSIS — Z1211 Encounter for screening for malignant neoplasm of colon: Secondary | ICD-10-CM

## 2022-01-12 DIAGNOSIS — E663 Overweight: Secondary | ICD-10-CM | POA: Diagnosis not present

## 2022-01-12 DIAGNOSIS — D508 Other iron deficiency anemias: Secondary | ICD-10-CM

## 2022-01-12 LAB — CBC
HCT: 41.1 % (ref 36.0–46.0)
Hemoglobin: 13.4 g/dL (ref 12.0–15.0)
MCHC: 32.7 g/dL (ref 30.0–36.0)
MCV: 81.3 fl (ref 78.0–100.0)
Platelets: 253 10*3/uL (ref 150.0–400.0)
RBC: 5.06 Mil/uL (ref 3.87–5.11)
RDW: 14 % (ref 11.5–15.5)
WBC: 8.9 10*3/uL (ref 4.0–10.5)

## 2022-01-12 LAB — TSH: TSH: 0.49 u[IU]/mL (ref 0.35–5.50)

## 2022-01-12 LAB — IBC + FERRITIN
Ferritin: 7.9 ng/mL — ABNORMAL LOW (ref 10.0–291.0)
Iron: 44 ug/dL (ref 42–145)
Saturation Ratios: 10.4 % — ABNORMAL LOW (ref 20.0–50.0)
TIBC: 424.2 ug/dL (ref 250.0–450.0)
Transferrin: 303 mg/dL (ref 212.0–360.0)

## 2022-01-12 LAB — VITAMIN D 25 HYDROXY (VIT D DEFICIENCY, FRACTURES): VITD: 61.32 ng/mL (ref 30.00–100.00)

## 2022-01-12 LAB — COMPREHENSIVE METABOLIC PANEL
ALT: 9 U/L (ref 0–35)
AST: 13 U/L (ref 0–37)
Albumin: 3.7 g/dL (ref 3.5–5.2)
Alkaline Phosphatase: 94 U/L (ref 39–117)
BUN: 10 mg/dL (ref 6–23)
CO2: 29 mEq/L (ref 19–32)
Calcium: 9.2 mg/dL (ref 8.4–10.5)
Chloride: 99 mEq/L (ref 96–112)
Creatinine, Ser: 0.91 mg/dL (ref 0.40–1.20)
GFR: 67.26 mL/min (ref >60.00)
Glucose, Bld: 100 mg/dL — ABNORMAL HIGH (ref 70–99)
Potassium: 3.9 mEq/L (ref 3.5–5.1)
Sodium: 137 mEq/L (ref 135–145)
Total Bilirubin: 0.4 mg/dL (ref 0.2–1.2)
Total Protein: 6.6 g/dL (ref 6.0–8.3)

## 2022-01-12 LAB — LIPID PANEL
Cholesterol: 205 mg/dL — ABNORMAL HIGH (ref 0–200)
HDL: 55.1 mg/dL (ref >39.00)
LDL Cholesterol: 116 mg/dL — ABNORMAL HIGH (ref 0–99)
NonHDL: 149.66
Total CHOL/HDL Ratio: 4
Triglycerides: 166 mg/dL — ABNORMAL HIGH (ref 0.0–149.0)
VLDL: 33.2 mg/dL (ref 0.0–40.0)

## 2022-01-12 LAB — VITAMIN B12: Vitamin B-12: 215 pg/mL (ref 211–911)

## 2022-01-12 LAB — HEMOGLOBIN A1C: Hgb A1c MFr Bld: 6.2 % (ref 4.6–6.5)

## 2022-01-12 LAB — FOLATE: Folate: 9.2 ng/mL (ref >5.9)

## 2022-01-12 MED ORDER — BUSPIRONE HCL 5 MG PO TABS: 5.0000 mg | ORAL_TABLET | Freq: Two times a day (BID) | ORAL | 1 refills | Status: DC

## 2022-01-12 NOTE — Assessment & Plan Note (Signed)
Patient currently on trazodone but this for insomnia.  Patient has been on alprazolam in the past but took herself off as she felt like she no longer needed it.  Patient is having difficulty with anxiety over the past month more so over the past 2 weeks.  No big life changes.  We will start patient on BuSpar 5 mg twice daily follow-up 1 month virtually for recheck.

## 2022-01-12 NOTE — Assessment & Plan Note (Signed)
Concern with gradual weight gain over the past year of approximate 15 to 20 pounds.  Patient is scared she can be diabetic again pending basic labs today continue lifestyle modifications

## 2022-01-12 NOTE — Assessment & Plan Note (Signed)
Per patient report vertical sleeve gastrectomy.

## 2022-01-12 NOTE — Assessment & Plan Note (Signed)
Had complications with lap band had to have it removed revised.  Per patient report she had a vertical sleeve gastrectomy
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
## 2022-01-12 NOTE — Assessment & Plan Note (Signed)
Patient had iron deficiency prior to gastric surgeries.  We will check anemia panel along with B12 folate and vitamin D in the setting of status post gastric bypass

## 2022-01-12 NOTE — Patient Instructions (Signed)
Nice to see you today I will be in touch with the labs once I have the results Follow up in 6 months for your physical. 1 month virtual follow up for your anxiety

## 2022-01-13 ENCOUNTER — Encounter: Payer: Self-pay | Admitting: Nurse Practitioner

## 2022-01-13 DIAGNOSIS — Z8639 Personal history of other endocrine, nutritional and metabolic disease: Secondary | ICD-10-CM

## 2022-01-13 DIAGNOSIS — E663 Overweight: Secondary | ICD-10-CM

## 2022-01-13 DIAGNOSIS — E7800 Pure hypercholesterolemia, unspecified: Secondary | ICD-10-CM

## 2022-01-13 DIAGNOSIS — R7303 Prediabetes: Secondary | ICD-10-CM

## 2022-01-14 ENCOUNTER — Telehealth: Payer: Self-pay | Admitting: Nurse Practitioner

## 2022-01-14 MED ORDER — WEGOVY 0.25 MG/0.5ML ~~LOC~~ SOAJ: 0.2500 mg | SUBCUTANEOUS | 0 refills | Status: DC

## 2022-01-14 NOTE — Telephone Encounter (Signed)
Patient returned your call,would like a call back 

## 2022-01-14 NOTE — Telephone Encounter (Signed)
Patient advised about her lab results and she saw them on mychart. Patient will let us know if there are issues with getting Wegovy medication from pharmacy. So far do not see that it needs PA

## 2022-01-16 NOTE — Telephone Encounter (Signed)
Sounds like her insurance plan will not cover the cost. Patient would need to call her insurance to see what they would cover, if you can give her the names of medications to check on, and patient would need to tell them its for weight loss not Diabetes when she calls to check on this. Thank you

## 2022-01-20 MED ORDER — SAXENDA 18 MG/3ML ~~LOC~~ SOPN: PEN_INJECTOR | SUBCUTANEOUS | 0 refills | Status: AC

## 2022-03-02 ENCOUNTER — Ambulatory Visit: Payer: No Typology Code available for payment source | Admitting: Nurse Practitioner

## 2022-03-02 VITALS — BP 118/72 | HR 72 | Temp 96.8°F | Resp 14 | Ht 64.0 in | Wt 167.0 lb

## 2022-03-02 DIAGNOSIS — G4709 Other insomnia: Secondary | ICD-10-CM

## 2022-03-02 DIAGNOSIS — L0291 Cutaneous abscess, unspecified: Secondary | ICD-10-CM

## 2022-03-02 DIAGNOSIS — F419 Anxiety disorder, unspecified: Secondary | ICD-10-CM

## 2022-03-02 MED ORDER — BUSPIRONE HCL 5 MG PO TABS: 5.0000 mg | ORAL_TABLET | Freq: Two times a day (BID) | ORAL | 1 refills | Status: DC

## 2022-03-02 MED ORDER — TRAZODONE HCL 100 MG PO TABS: ORAL_TABLET | ORAL | 3 refills | Status: DC

## 2022-03-02 MED ORDER — SULFAMETHOXAZOLE-TRIMETHOPRIM 800-160 MG PO TABS: 1.0000 | ORAL_TABLET | Freq: Two times a day (BID) | ORAL | 0 refills | Status: AC

## 2022-03-02 NOTE — Assessment & Plan Note (Signed)
Patient currently maintained on trazodone.  Tolerates medication well.  Sent in 1 years worth of refills.

## 2022-03-02 NOTE — Assessment & Plan Note (Signed)
Administer PHQ-9 and GAD-7 in office.  Patient has had good response in scores.  Tolerating buspirone 5 mg twice daily well.  Refill sent to pharmacy today

## 2022-03-02 NOTE — Progress Notes (Signed)
Established Patient Office Visit  Subjective   Patient ID: Lori Summers, female    DOB: 05/20/1960  Age: 61 y.o. MRN: [id number]  Chief Complaint  Patient presents with   Anxiety    Follow up   Mass    Under right breast came up on 02/27/22.      Anxiety: Patient was seen on 01/12/2022.  She was started on BuSpar 5 mg twice daily is here for follow-up. States that she was doing well with the medication and no ade.   Mass: Sates that she noticed it Friday. States that she noticed the lump under the breast. States that she tried to pop it and got some ooze out States that her bra line has been irritating. States the past couple of days she has been squeezing it wihtou results. States that it is tender because she was trying to express it.  No history of breast cancers or masses that required further investigation.  No history of breast cancer.     Review of Systems  Constitutional:  Negative for chills and fever.  Respiratory:  Negative for shortness of breath.   Cardiovascular:  Negative for chest pain.  Skin:        "+" mass/lump  Psychiatric/Behavioral:  Negative for hallucinations and suicidal ideas.       Objective:     BP 118/72   Pulse 72   Temp (!) 96.8 F (36 C) (Temporal)   Resp 14   Ht 5\' 4"  (1.626 m)   Wt 167 lb (75.8 kg)   LMP 01/07/2013   SpO2 98%   BMI 28.67 kg/m  BP Readings from Last 3 Encounters:  03/02/22 118/72  01/12/22 116/74  12/25/20 130/81   Wt Readings from Last 3 Encounters:  03/02/22 167 lb (75.8 kg)  01/12/22 162 lb 4 oz (73.6 kg)  12/25/20 156 lb 1.6 oz (70.8 kg)      Physical Exam Vitals and nursing note reviewed.  Constitutional:      Appearance: Normal appearance.  Cardiovascular:     Rate and Rhythm: Normal rate and regular rhythm.     Heart sounds: Normal heart sounds.  Pulmonary:     Effort: Pulmonary effort is normal.     Breath sounds: Normal breath sounds.  Skin:    Findings: Lesion present.        Neurological:     Mental Status: She is alert.      No results found for any visits on 03/02/22.    The 10-year ASCVD risk score (Arnett DK, et al., 2019) is: 3.9%    Assessment & Plan:   Problem List Items Addressed This Visit       Other   Anxiety - Primary    Administer PHQ-9 and GAD-7 in office.  Patient has had good response in scores.  Tolerating buspirone 5 mg twice daily well.  Refill sent to pharmacy today      Relevant Medications   busPIRone (BUSPAR) 5 MG tablet   traZODone (DESYREL) 100 MG tablet   Other insomnia    Patient currently maintained on trazodone.  Tolerates medication well.  Sent in 1 years worth of refills.      Relevant Medications   traZODone (DESYREL) 100 MG tablet   Abscess    Not blanchable in office.  We will put patient on Bactrim DS 1 tab twice daily for 5 days.  Follow-up if no improvement      Relevant Medications   sulfamethoxazole-trimethoprim (BACTRIM  DS) 800-160 MG tablet    Return in about 4 months (around 07/03/2022) for A1C and weight recheck .    Audria Nine, NP

## 2022-03-02 NOTE — Assessment & Plan Note (Signed)
Not blanchable in office.  We will put patient on Bactrim DS 1 tab twice daily for 5 days.  Follow-up if no improvement

## 2022-03-02 NOTE — Patient Instructions (Signed)
Nice to see you today I want to see you in 4 months to recheck your A1C (that is 6 months from when we last checked it) Follow up sooner if you need me  I sent in an antibiotic to the pharmacy

## 2022-10-28 ENCOUNTER — Other Ambulatory Visit: Payer: Self-pay | Admitting: Nurse Practitioner

## 2022-10-28 DIAGNOSIS — F419 Anxiety disorder, unspecified: Secondary | ICD-10-CM

## 2022-10-28 NOTE — Telephone Encounter (Signed)
I contacted patient,she said it must have been on auto refill,she doesn't need the medication right now

## 2022-10-28 NOTE — Telephone Encounter (Signed)
Will refuse pt does not need medication.

## 2022-10-28 NOTE — Telephone Encounter (Signed)
Lvmtcb

## 2022-12-31 ENCOUNTER — Encounter (INDEPENDENT_AMBULATORY_CARE_PROVIDER_SITE_OTHER): Payer: Self-pay

## 2023-02-12 ENCOUNTER — Ambulatory Visit: Payer: No Typology Code available for payment source | Admitting: Nurse Practitioner

## 2023-02-12 ENCOUNTER — Encounter: Payer: Self-pay | Admitting: Nurse Practitioner

## 2023-02-12 VITALS — BP 136/84 | HR 72 | Temp 98.2°F | Ht 64.0 in | Wt 158.6 lb

## 2023-02-12 DIAGNOSIS — Z9884 Bariatric surgery status: Secondary | ICD-10-CM | POA: Diagnosis not present

## 2023-02-12 DIAGNOSIS — E663 Overweight: Secondary | ICD-10-CM | POA: Diagnosis not present

## 2023-02-12 DIAGNOSIS — E7800 Pure hypercholesterolemia, unspecified: Secondary | ICD-10-CM | POA: Insufficient documentation

## 2023-02-12 DIAGNOSIS — F419 Anxiety disorder, unspecified: Secondary | ICD-10-CM

## 2023-02-12 DIAGNOSIS — R7303 Prediabetes: Secondary | ICD-10-CM | POA: Insufficient documentation

## 2023-02-12 DIAGNOSIS — F32A Depression, unspecified: Secondary | ICD-10-CM

## 2023-02-12 LAB — POCT GLYCOSYLATED HEMOGLOBIN (HGB A1C): Hemoglobin A1C: 6 % — AB (ref 4.0–5.6)

## 2023-02-12 MED ORDER — WEGOVY 0.25 MG/0.5ML ~~LOC~~ SOAJ
0.2500 mg | SUBCUTANEOUS | 0 refills | Status: DC
Start: 2023-02-12 — End: 2023-02-15

## 2023-02-12 MED ORDER — SERTRALINE HCL 50 MG PO TABS
ORAL_TABLET | ORAL | 0 refills | Status: DC
Start: 2023-02-12 — End: 2023-03-15

## 2023-02-12 NOTE — Assessment & Plan Note (Signed)
Was doing well on buspirone 5 mg twice daily patient not having some depression symptoms given recent happenings in her personal life.  Will start on sertraline 25 mg nightly for 2 weeks then bump up to 50 mg thereafter.  Follow-up 6 weeks.  Patient denies HI/SI/AVH

## 2023-02-12 NOTE — Assessment & Plan Note (Signed)
History of the same.  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

## 2023-02-12 NOTE — Progress Notes (Signed)
Established Patient Office Visit  Subjective   Patient ID: Lori Summers, female    DOB: Sep 04, 1960  Age: 62 y.o. MRN: [id number]  Chief Complaint  Patient presents with   Follow-up    Pt complains of being okay other than personal life stressors.    Medication Refill    Buspar and Trazodone.       Anxiety: states that something happened to her grandson o njune 6th. States that she has moved in with her son and daughter in Social worker. States that she is having trouble sleeping. States that she is worried about her grandson. States that she is eating.states that she has some guilt doing some thing that she use to do with erh grandson but he is unable to gpartcipate. States that she is not social like she use to be and making it through work    Insomnia: states that the trazdone helps get her to sleep but she will wake up  Stataes goes to bed 10 and get up at 5 btu awake at midnight   Overweight: states that she is doing one meal a day. States that she has lost approc 10 pounds since last visit. States that she is doing intermittent fasting. States that she will eat lujch at work and a snack after work States that she is doing minimal walking and on the week  States that she will drink sweet tea on the weekends. Water through the week   Vitamin def: states that she is cold all the times ince June. Has a history of iron defec. States she has had lap band and then the sleeve         02/12/2023    3:08 PM 01/12/2022    2:49 PM  PHQ9 SCORE ONLY  PHQ-9 Total Score 5 0       03/02/2022    3:44 PM  GAD 7 : Generalized Anxiety Score  Nervous, Anxious, on Edge 1  Control/stop worrying 0  Worry too much - different things 0  Trouble relaxing 1  Restless 0  Easily annoyed or irritable 0  Afraid - awful might happen 0  Total GAD 7 Score 2  Anxiety Difficulty Not difficult at all             Review of Systems  Constitutional:  Negative for chills and fever.  Respiratory:  Negative  for shortness of breath.   Cardiovascular:  Negative for chest pain.  Gastrointestinal:  Negative for abdominal pain, constipation, diarrhea, nausea and vomiting.       BM every 3rd day   Neurological:  Negative for headaches.  Psychiatric/Behavioral:  Negative for hallucinations and suicidal ideas. The patient is nervous/anxious.       Objective:     BP 136/84   Pulse 72   Temp 98.2 F (36.8 C) (Oral)   Ht 5\' 4"  (1.626 m)   Wt 158 lb 9.6 oz (71.9 kg)   LMP 01/07/2013   SpO2 96%   BMI 27.22 kg/m  BP Readings from Last 3 Encounters:  02/12/23 136/84  03/02/22 118/72  01/12/22 116/74   Wt Readings from Last 3 Encounters:  02/12/23 158 lb 9.6 oz (71.9 kg)  03/02/22 167 lb (75.8 kg)  01/12/22 162 lb 4 oz (73.6 kg)      Physical Exam Vitals and nursing note reviewed.  Constitutional:      Appearance: Normal appearance.  Cardiovascular:     Rate and Rhythm: Normal rate and regular rhythm.  Heart sounds: Normal heart sounds.  Pulmonary:     Effort: Pulmonary effort is normal.     Breath sounds: Normal breath sounds.  Abdominal:     General: Bowel sounds are normal. There is no distension.     Palpations: There is no mass.     Tenderness: There is no abdominal tenderness.     Hernia: No hernia is present.  Musculoskeletal:     Right lower leg: Edema (trace) present.     Left lower leg: Edema (trace) present.  Neurological:     Mental Status: She is alert.      No results found for any visits on 02/12/23.    The 10-year ASCVD risk score (Arnett DK, et al., 2019) is: 5.7%    Assessment & Plan:   Problem List Items Addressed This Visit       Other   S/P gastric bypass    History of the same.  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      Relevant Medications   Semaglutide-Weight Management (WEGOVY) 0.25 MG/0.5ML SOAJ   Other Relevant Orders   CBC   Vitamin B12   VITAMIN D 25 Hydroxy (Vit-D  Deficiency, Fractures)   Iron, TIBC and Ferritin Panel   Anxiety and depression    Was doing well on buspirone 5 mg twice daily patient not having some depression symptoms given recent happenings in her personal life.  Will start on sertraline 25 mg nightly for 2 weeks then bump up to 50 mg thereafter.  Follow-up 6 weeks.  Patient denies HI/SI/AVH      Relevant Medications   sertraline (ZOLOFT) 50 MG tablet   Other Relevant Orders   CBC   Comprehensive metabolic panel   TSH   Overweight - Primary    Continue working on loss of modifications we will try Wegovy 0.25 mg 1 injection once weekly for a month.  Patient has a comorbidities of elevated LDL and prediabetes.  With a history of diabetes      Relevant Medications   Semaglutide-Weight Management (WEGOVY) 0.25 MG/0.5ML SOAJ   Prediabetes    Improvement with lifestyle modifications.  Patient is lost approximately 8 pounds on room.      Relevant Medications   Semaglutide-Weight Management (WEGOVY) 0.25 MG/0.5ML SOAJ   Other Relevant Orders   POCT glycosylated hemoglobin (Hb A1C)   Elevated LDL cholesterol level    Pending lipid panel today      Relevant Medications   Semaglutide-Weight Management (WEGOVY) 0.25 MG/0.5ML SOAJ   Other Relevant Orders   Lipid panel    Return in about 6 weeks (around 03/26/2023) for MDD/GAD medication recheck .    Audria Nine, NP

## 2023-02-12 NOTE — Patient Instructions (Signed)
Nice to see you today I will be in touch with the labs once I have them Follow up with me in 6 weeks, sooner if you need me 

## 2023-02-12 NOTE — Assessment & Plan Note (Signed)
Continue working on loss of modifications we will try Wegovy 0.25 mg 1 injection once weekly for a month.  Patient has a comorbidities of elevated LDL and prediabetes.  With a history of diabetes

## 2023-02-12 NOTE — Assessment & Plan Note (Signed)
Pending lipid panel today 

## 2023-02-12 NOTE — Assessment & Plan Note (Signed)
Improvement with lifestyle modifications.  Patient is lost approximately 8 pounds on room.

## 2023-02-13 ENCOUNTER — Other Ambulatory Visit: Payer: Self-pay | Admitting: Nurse Practitioner

## 2023-02-13 DIAGNOSIS — G4709 Other insomnia: Secondary | ICD-10-CM

## 2023-02-13 DIAGNOSIS — F419 Anxiety disorder, unspecified: Secondary | ICD-10-CM

## 2023-02-13 LAB — CBC
HCT: 39.9 % (ref 35.0–45.0)
Hemoglobin: 12.4 g/dL (ref 11.7–15.5)
MCH: 23.9 pg — ABNORMAL LOW (ref 27.0–33.0)
MCHC: 31.1 g/dL — ABNORMAL LOW (ref 32.0–36.0)
MCV: 77 fL — ABNORMAL LOW (ref 80.0–100.0)
MPV: 10.5 fL (ref 7.5–12.5)
Platelets: 273 10*3/uL (ref 140–400)
RBC: 5.18 10*6/uL — ABNORMAL HIGH (ref 3.80–5.10)
RDW: 13.5 % (ref 11.0–15.0)
WBC: 7.8 10*3/uL (ref 3.8–10.8)

## 2023-02-13 LAB — LIPID PANEL
Cholesterol: 228 mg/dL — ABNORMAL HIGH (ref <200)
HDL: 67 mg/dL (ref >=50)
LDL Cholesterol (Calc): 143 mg/dL — ABNORMAL HIGH
Non-HDL Cholesterol (Calc): 161 mg/dL — ABNORMAL HIGH (ref <130)
Total CHOL/HDL Ratio: 3.4 (calc) (ref <5.0)
Triglycerides: 74 mg/dL (ref <150)

## 2023-02-13 LAB — COMPREHENSIVE METABOLIC PANEL
AG Ratio: 1.3 (calc) (ref 1.0–2.5)
ALT: 8 U/L (ref 6–29)
AST: 12 U/L (ref 10–35)
Albumin: 3.8 g/dL (ref 3.6–5.1)
Alkaline phosphatase (APISO): 78 U/L (ref 37–153)
BUN: 13 mg/dL (ref 7–25)
CO2: 28 mmol/L (ref 20–32)
Calcium: 9.3 mg/dL (ref 8.6–10.4)
Chloride: 103 mmol/L (ref 98–110)
Creat: 0.86 mg/dL (ref 0.50–1.05)
Globulin: 2.9 g/dL (ref 1.9–3.7)
Glucose, Bld: 86 mg/dL (ref 65–99)
Potassium: 3.9 mmol/L (ref 3.5–5.3)
Sodium: 139 mmol/L (ref 135–146)
Total Bilirubin: 0.5 mg/dL (ref 0.2–1.2)
Total Protein: 6.7 g/dL (ref 6.1–8.1)

## 2023-02-13 LAB — VITAMIN B12: Vitamin B-12: 333 pg/mL (ref 200–1100)

## 2023-02-13 LAB — IRON,TIBC AND FERRITIN PANEL
%SAT: 8 % — ABNORMAL LOW (ref 16–45)
Ferritin: 7 ng/mL — ABNORMAL LOW (ref 16–288)
Iron: 32 ug/dL — ABNORMAL LOW (ref 45–160)
TIBC: 387 ug/dL (ref 250–450)

## 2023-02-13 LAB — VITAMIN D 25 HYDROXY (VIT D DEFICIENCY, FRACTURES): Vit D, 25-Hydroxy: 67 ng/mL (ref 30–100)

## 2023-02-13 LAB — TSH: TSH: 0.75 m[IU]/L (ref 0.40–4.50)

## 2023-02-15 ENCOUNTER — Encounter: Payer: Self-pay | Admitting: Nurse Practitioner

## 2023-02-15 ENCOUNTER — Other Ambulatory Visit (HOSPITAL_COMMUNITY): Payer: Self-pay

## 2023-02-15 ENCOUNTER — Other Ambulatory Visit: Payer: Self-pay

## 2023-02-15 ENCOUNTER — Encounter: Payer: Self-pay | Admitting: Hematology and Oncology

## 2023-02-15 DIAGNOSIS — E7800 Pure hypercholesterolemia, unspecified: Secondary | ICD-10-CM

## 2023-02-15 DIAGNOSIS — R7303 Prediabetes: Secondary | ICD-10-CM

## 2023-02-15 DIAGNOSIS — E663 Overweight: Secondary | ICD-10-CM

## 2023-02-15 MED ORDER — WEGOVY 0.25 MG/0.5ML ~~LOC~~ SOAJ
0.2500 mg | SUBCUTANEOUS | 0 refills | Status: DC
Start: 2023-02-15 — End: 2023-03-15
  Filled 2023-02-15 – 2023-02-22 (×2): qty 2, 28d supply, fill #0

## 2023-02-15 NOTE — Telephone Encounter (Signed)
See mychart message chain

## 2023-02-15 NOTE — Telephone Encounter (Signed)
Pt states her pharmacy doesn't have WEGOVY in stock. Pt requested for rx to be sent to [facility] pharmacy? Call back # [phone]

## 2023-02-16 ENCOUNTER — Other Ambulatory Visit (HOSPITAL_COMMUNITY): Payer: Self-pay

## 2023-02-16 ENCOUNTER — Telehealth: Payer: Self-pay

## 2023-02-16 ENCOUNTER — Encounter: Payer: Self-pay | Admitting: Nurse Practitioner

## 2023-02-16 NOTE — Telephone Encounter (Signed)
Pharmacy Patient Advocate Encounter   Received notification from Patient Advice Request messages that prior authorization for Wegovy 0.25MG /0.5ML auto-injectors is required/requested.   Insurance verification completed.   The patient is insured through [employer] .   Per test claim: PA required; PA submitted to [facility] via CoverMyMeds Key/confirmation #/EOC [id number] Status is pending

## 2023-02-19 ENCOUNTER — Encounter: Payer: Self-pay | Admitting: Nurse Practitioner

## 2023-02-19 DIAGNOSIS — E663 Overweight: Secondary | ICD-10-CM

## 2023-02-19 DIAGNOSIS — E7800 Pure hypercholesterolemia, unspecified: Secondary | ICD-10-CM

## 2023-02-19 DIAGNOSIS — R7303 Prediabetes: Secondary | ICD-10-CM

## 2023-02-19 NOTE — Telephone Encounter (Signed)
Additional information has been requested from the patient's insurance in order to proceed with the prior authorization request. Requested information has been sent, or form has been filled out and faxed back to [phone]

## 2023-02-22 ENCOUNTER — Other Ambulatory Visit (HOSPITAL_COMMUNITY): Payer: Self-pay

## 2023-02-22 ENCOUNTER — Other Ambulatory Visit: Payer: Self-pay

## 2023-02-22 NOTE — Telephone Encounter (Signed)
Left detailed voicemail for patient to call the office back if any questions or concerns.

## 2023-02-22 NOTE — Telephone Encounter (Signed)
Pharmacy Patient Advocate Encounter  Received notification from  [facility]  that Prior Authorization for Wegovy 0.25MG /0.5ML auto-injectors has been APPROVED from 02/16/23 to 02/16/24   PA #/Case ID/Reference #: [id number]

## 2023-02-22 NOTE — Telephone Encounter (Signed)
Contacted pt in regards to PA approval for [facility] prescription. Pt verbalized understanding and repeated back to me.

## 2023-02-22 NOTE — Telephone Encounter (Signed)
Pt called back returning Janae's call. Call back # [phone].

## 2023-03-15 ENCOUNTER — Other Ambulatory Visit: Payer: Self-pay | Admitting: Nurse Practitioner

## 2023-03-15 ENCOUNTER — Other Ambulatory Visit (HOSPITAL_COMMUNITY): Payer: Self-pay

## 2023-03-15 DIAGNOSIS — F32A Depression, unspecified: Secondary | ICD-10-CM

## 2023-03-15 DIAGNOSIS — F419 Anxiety disorder, unspecified: Secondary | ICD-10-CM

## 2023-03-15 MED ORDER — WEGOVY 0.5 MG/0.5ML ~~LOC~~ SOAJ
0.5000 mg | SUBCUTANEOUS | 0 refills | Status: DC
  Filled 2023-03-15 – 2023-03-23 (×5): qty 2, 28d supply, fill #0

## 2023-03-16 ENCOUNTER — Encounter: Payer: Self-pay | Admitting: Hematology and Oncology

## 2023-03-16 ENCOUNTER — Other Ambulatory Visit (HOSPITAL_COMMUNITY): Payer: Self-pay

## 2023-03-22 ENCOUNTER — Other Ambulatory Visit (HOSPITAL_COMMUNITY): Payer: Self-pay

## 2023-03-22 ENCOUNTER — Telehealth: Payer: Self-pay

## 2023-03-22 NOTE — Telephone Encounter (Signed)
Pharmacy Patient Advocate Encounter   Received notification from Patient Advice Request messages that prior authorization for [facility] 0.5MG /0.5ML auto-injectors is required/requested.   Insurance verification completed.   The patient is insured through [employer] .   Per test claim: PA required; PA submitted to above mentioned insurance via CoverMyMeds Key/confirmation #/EOC [facility] Status is pending

## 2023-03-23 ENCOUNTER — Other Ambulatory Visit (HOSPITAL_COMMUNITY): Payer: Self-pay

## 2023-03-23 NOTE — Telephone Encounter (Signed)
Pharmacy Patient Advocate Encounter  Received notification from [employer] that Prior Authorization for [facility] 0.5MG /0.5ML auto-injectors  has been CANCELLED due to duplicate request, approval already in system. PA end date: 02/16/24.   PA #/Case ID/Reference #: [id number]   Ran test claim, received paid claim ($24.99 copay).

## 2023-03-26 ENCOUNTER — Ambulatory Visit: Payer: No Typology Code available for payment source | Admitting: Nurse Practitioner

## 2023-04-02 ENCOUNTER — Ambulatory Visit: Payer: No Typology Code available for payment source | Admitting: Nurse Practitioner

## 2023-04-13 ENCOUNTER — Other Ambulatory Visit (HOSPITAL_COMMUNITY): Payer: Self-pay

## 2023-04-13 ENCOUNTER — Other Ambulatory Visit: Payer: Self-pay | Admitting: Nurse Practitioner

## 2023-04-13 DIAGNOSIS — E7800 Pure hypercholesterolemia, unspecified: Secondary | ICD-10-CM

## 2023-04-13 DIAGNOSIS — E663 Overweight: Secondary | ICD-10-CM

## 2023-04-13 DIAGNOSIS — R7303 Prediabetes: Secondary | ICD-10-CM

## 2023-04-13 NOTE — Telephone Encounter (Signed)
LAST APPOINTMENT DATE: 02/12/23  NEXT APPOINTMENT DATE: 04/23/2023  Semaglutide (WEGOVY) 0.5 MG/0.5ML SOAJ  LAST REFILL: 03/15/23  QTY: 2 mL 0RF

## 2023-04-14 ENCOUNTER — Other Ambulatory Visit (HOSPITAL_COMMUNITY): Payer: Self-pay

## 2023-04-14 MED ORDER — WEGOVY 1 MG/0.5ML ~~LOC~~ SOAJ
1.0000 mg | SUBCUTANEOUS | 0 refills | Status: DC
  Filled 2023-04-14 – 2023-04-26 (×4): qty 2, 28d supply, fill #0

## 2023-04-19 ENCOUNTER — Other Ambulatory Visit (HOSPITAL_COMMUNITY): Payer: Self-pay

## 2023-04-19 ENCOUNTER — Encounter: Payer: Self-pay | Admitting: Hematology and Oncology

## 2023-04-21 ENCOUNTER — Encounter (HOSPITAL_COMMUNITY): Payer: Self-pay

## 2023-04-21 ENCOUNTER — Other Ambulatory Visit (HOSPITAL_COMMUNITY): Payer: Self-pay

## 2023-04-21 ENCOUNTER — Encounter: Payer: Self-pay | Admitting: Nurse Practitioner

## 2023-04-21 NOTE — Telephone Encounter (Signed)
See mychart messages

## 2023-04-23 ENCOUNTER — Other Ambulatory Visit (HOSPITAL_COMMUNITY): Payer: Self-pay

## 2023-04-23 ENCOUNTER — Telehealth: Payer: Self-pay

## 2023-04-23 ENCOUNTER — Encounter: Payer: Self-pay | Admitting: Nurse Practitioner

## 2023-04-23 ENCOUNTER — Ambulatory Visit: Payer: No Typology Code available for payment source | Admitting: Nurse Practitioner

## 2023-04-23 VITALS — BP 112/84 | HR 76 | Temp 97.8°F | Ht 64.0 in | Wt 153.6 lb

## 2023-04-23 DIAGNOSIS — F419 Anxiety disorder, unspecified: Secondary | ICD-10-CM | POA: Diagnosis not present

## 2023-04-23 DIAGNOSIS — E663 Overweight: Secondary | ICD-10-CM

## 2023-04-23 DIAGNOSIS — F32A Depression, unspecified: Secondary | ICD-10-CM

## 2023-04-23 DIAGNOSIS — D508 Other iron deficiency anemias: Secondary | ICD-10-CM

## 2023-04-23 NOTE — Patient Instructions (Signed)
Nice to see you today We will continue the medications as is Follow up with me in 3 months for a weight/medication recheck

## 2023-04-23 NOTE — Assessment & Plan Note (Signed)
Patient is doing well on Wegovy.  Continue does have some constipation this week she can take MiraLAX every day.  Follow-up 3 months

## 2023-04-23 NOTE — Telephone Encounter (Signed)
Pharmacy Patient Advocate Encounter   Received notification from Patient Advice Request messages that prior authorization for [facility] is required/requested.   Insurance verification completed.   The patient is insured through  [employer]  .   Per test claim: PA required; PA submitted to above mentioned insurance via CoverMyMeds Key/confirmation #/EOC [id number] Status is pending

## 2023-04-23 NOTE — Assessment & Plan Note (Signed)
History of the same requiring iron infusions.  Patient was established with hematology in the past called to make an appointment they said she needed a referral ambulatory referral to hematology placed today

## 2023-04-23 NOTE — Progress Notes (Signed)
Established Patient Office Visit  Subjective   Patient ID: Lori Summers, female    DOB: 1960-11-23  Age: 62 y.o. MRN: [id number]  Chief Complaint  Patient presents with   Medication Management    MDD and GAD recheck. Pt states that she is doing excellent on meds.     HPI  MDD/GAD: Patient was seen by me on 02/12/2023 states has been having anxiety since about June 2024.  Patient was maintained on buspirone 5 mg twice daily but given last office that we did start sertraline 25 mg nightly for 2 weeks and then bumped up to 50 mg nightly thereafter patient is here for follow-up.  Patient is tolerating the medication well.  States that she did have headache the first couple days of starting but has since resolved.  Patient states she feels like "I am back to my old self".  Patient states she is sleeping well  Overweight: Patient does have a history of gastric bypass surgery patient was started on Wegovy 0.25 mg once weekly for a month then elevated up.  Patient has a history of diabetes currently prediabetic and elevated LDL.  Patient currently maintained on Wegovy 1 mg once a week. States that sh ehas not had any toruble tolerating it. States when she give herself injection she feels that she is not hungry for the first 2 week so fthe dose but then feels that she does get more hungry       02/12/2023    3:08 PM 01/12/2022    2:49 PM  PHQ9 SCORE ONLY  PHQ-9 Total Score 5 0       02/12/2023    3:25 PM 03/02/2022    3:44 PM  GAD 7 : Generalized Anxiety Score  Nervous, Anxious, on Edge 0 1  Control/stop worrying 1 0  Worry too much - different things 1 0  Trouble relaxing 1 1  Restless 0 0  Easily annoyed or irritable 0 0  Afraid - awful might happen 0 0  Total GAD 7 Score 3 2  Anxiety Difficulty Not difficult at all Not difficult at all       Review of Systems  Constitutional:  Negative for chills and fever.  Respiratory:  Negative for shortness of breath.   Cardiovascular:   Negative for chest pain.  Gastrointestinal:  Positive for constipation. Negative for abdominal pain, diarrhea, nausea and vomiting.  Neurological:  Negative for dizziness and headaches.  Psychiatric/Behavioral:  Negative for hallucinations and suicidal ideas. The patient does not have insomnia.       Objective:     BP 112/84   Pulse 76   Temp 97.8 F (36.6 C) (Oral)   Ht 5\' 4"  (1.626 m)   Wt 153 lb 9.6 oz (69.7 kg)   LMP 01/07/2013   SpO2 96%   BMI 26.37 kg/m  BP Readings from Last 3 Encounters:  04/23/23 112/84  02/12/23 136/84  03/02/22 118/72   Wt Readings from Last 3 Encounters:  04/23/23 153 lb 9.6 oz (69.7 kg)  02/12/23 158 lb 9.6 oz (71.9 kg)  03/02/22 167 lb (75.8 kg)   SpO2 Readings from Last 3 Encounters:  04/23/23 96%  02/12/23 96%  03/02/22 98%      Physical Exam Vitals and nursing note reviewed.  Constitutional:      Appearance: Normal appearance.  Cardiovascular:     Rate and Rhythm: Normal rate and regular rhythm.     Heart sounds: Normal heart sounds.  Pulmonary:  Effort: Pulmonary effort is normal.     Breath sounds: Normal breath sounds.  Abdominal:     General: Bowel sounds are normal. There is no distension.     Palpations: There is no mass.     Tenderness: There is no abdominal tenderness.     Hernia: No hernia is present.  Neurological:     Mental Status: She is alert.      No results found for any visits on 04/23/23.    The 10-year ASCVD risk score (Arnett DK, et al., 2019) is: 3.8%    Assessment & Plan:   Problem List Items Addressed This Visit       Other   Anxiety and depression - Primary    Patient currently maintained on sertraline 50 mg daily along with buspirone 5 mg twice daily.  Patient denies HI/SI/AVH.  Patient has a good response to medications continue medication as prescribed      Iron deficiency anemia    History of the same requiring iron infusions.  Patient was established with hematology in the  past called to make an appointment they said she needed a referral ambulatory referral to hematology placed today      Relevant Orders   Ambulatory referral to Hematology / Oncology   Overweight    Patient is doing well on Wegovy.  Continue does have some constipation this week she can take MiraLAX every day.  Follow-up 3 months       Return in about 3 months (around 07/22/2023) for wegovy/weight recheck .    Audria Nine, NP

## 2023-04-23 NOTE — Assessment & Plan Note (Signed)
Patient currently maintained on sertraline 50 mg daily along with buspirone 5 mg twice daily.  Patient denies HI/SI/AVH.  Patient has a good response to medications continue medication as prescribed

## 2023-04-26 ENCOUNTER — Other Ambulatory Visit (HOSPITAL_COMMUNITY): Payer: Self-pay

## 2023-04-27 ENCOUNTER — Other Ambulatory Visit (HOSPITAL_COMMUNITY): Payer: Self-pay

## 2023-04-27 NOTE — Telephone Encounter (Signed)
Pt made aware of approved PA for wegovy.

## 2023-04-27 NOTE — Telephone Encounter (Signed)
Pharmacy Patient Advocate Encounter  Received notification from  [employer]  that Prior Authorization for [facility] 0.25MG /0.5ML auto-injectors has been APPROVED from 04/26/2023 to 02/16/2024   PA #/Case ID/Reference #: [id number]

## 2023-04-28 ENCOUNTER — Other Ambulatory Visit (HOSPITAL_COMMUNITY): Payer: Self-pay

## 2023-05-05 ENCOUNTER — Telehealth: Payer: Self-pay | Admitting: Hematology and Oncology

## 2023-05-05 NOTE — Telephone Encounter (Signed)
Scheduled appointment per patients request. Patient is aware of the made appointments.

## 2023-05-13 ENCOUNTER — Inpatient Hospital Stay (HOSPITAL_BASED_OUTPATIENT_CLINIC_OR_DEPARTMENT_OTHER): Payer: No Typology Code available for payment source | Admitting: Hematology and Oncology

## 2023-05-13 ENCOUNTER — Telehealth: Payer: Self-pay | Admitting: Hematology and Oncology

## 2023-05-13 ENCOUNTER — Other Ambulatory Visit: Payer: Self-pay | Admitting: Hematology and Oncology

## 2023-05-13 ENCOUNTER — Inpatient Hospital Stay: Payer: No Typology Code available for payment source

## 2023-05-13 DIAGNOSIS — D500 Iron deficiency anemia secondary to blood loss (chronic): Secondary | ICD-10-CM

## 2023-05-13 DIAGNOSIS — K912 Postsurgical malabsorption, not elsewhere classified: Secondary | ICD-10-CM

## 2023-05-13 NOTE — Progress Notes (Signed)
Rescheduled

## 2023-05-13 NOTE — Telephone Encounter (Signed)
Rescheduled appointments per patients request via incoming call. Patient is aware of the changes made to her upcoming appointments. 

## 2023-05-16 ENCOUNTER — Encounter: Payer: Self-pay | Admitting: Hematology and Oncology

## 2023-05-17 ENCOUNTER — Other Ambulatory Visit (HOSPITAL_COMMUNITY): Payer: Self-pay

## 2023-05-17 MED ORDER — WEGOVY 1.7 MG/0.75ML ~~LOC~~ SOAJ
1.7000 mg | SUBCUTANEOUS | 0 refills | Status: DC
  Filled 2023-05-17 – 2023-06-08 (×7): qty 3, 28d supply, fill #0

## 2023-05-21 ENCOUNTER — Encounter: Payer: Self-pay | Admitting: Nurse Practitioner

## 2023-05-24 ENCOUNTER — Other Ambulatory Visit (HOSPITAL_COMMUNITY): Payer: Self-pay

## 2023-05-26 ENCOUNTER — Other Ambulatory Visit (HOSPITAL_COMMUNITY): Payer: Self-pay

## 2023-06-01 ENCOUNTER — Telehealth: Payer: Self-pay

## 2023-06-01 ENCOUNTER — Other Ambulatory Visit (HOSPITAL_COMMUNITY): Payer: Self-pay

## 2023-06-01 NOTE — Telephone Encounter (Signed)
 Pharmacy Patient Advocate Encounter   Received notification from CoverMyMeds that prior authorization for Wegovy  1.7MG /0.75ML auto-injectors is required/requested.   Insurance verification completed.   The patient is insured through [employer] .   Per test claim: PA required; PA submitted to above mentioned insurance via CoverMyMeds Key/confirmation #/EOC [id number] Status is pending

## 2023-06-02 ENCOUNTER — Other Ambulatory Visit (HOSPITAL_COMMUNITY): Payer: Self-pay

## 2023-06-02 NOTE — Telephone Encounter (Signed)
 Looks like the PA request was submitted yesterday, please see telephone encounter from Burke Carolus, dated 06/01/2023 for further information. Thank you

## 2023-06-03 ENCOUNTER — Encounter: Payer: Self-pay | Admitting: Hematology and Oncology

## 2023-06-03 ENCOUNTER — Other Ambulatory Visit (HOSPITAL_COMMUNITY): Payer: Self-pay

## 2023-06-03 ENCOUNTER — Inpatient Hospital Stay: Payer: No Typology Code available for payment source | Attending: Hematology and Oncology

## 2023-06-03 ENCOUNTER — Inpatient Hospital Stay: Payer: No Typology Code available for payment source | Admitting: Hematology and Oncology

## 2023-06-03 VITALS — BP 135/82 | HR 66 | Temp 97.8°F | Resp 15 | Wt 154.5 lb

## 2023-06-03 DIAGNOSIS — Z801 Family history of malignant neoplasm of trachea, bronchus and lung: Secondary | ICD-10-CM | POA: Diagnosis not present

## 2023-06-03 DIAGNOSIS — K912 Postsurgical malabsorption, not elsewhere classified: Secondary | ICD-10-CM | POA: Diagnosis not present

## 2023-06-03 DIAGNOSIS — D509 Iron deficiency anemia, unspecified: Secondary | ICD-10-CM | POA: Insufficient documentation

## 2023-06-03 DIAGNOSIS — Z808 Family history of malignant neoplasm of other organs or systems: Secondary | ICD-10-CM | POA: Diagnosis not present

## 2023-06-03 DIAGNOSIS — D500 Iron deficiency anemia secondary to blood loss (chronic): Secondary | ICD-10-CM

## 2023-06-03 DIAGNOSIS — Z9884 Bariatric surgery status: Secondary | ICD-10-CM | POA: Insufficient documentation

## 2023-06-03 LAB — RETIC PANEL
Immature Retic Fract: 6 % (ref 2.3–15.9)
RBC.: 4.88 MIL/uL (ref 3.87–5.11)
Retic Count, Absolute: 29.8 10*3/uL (ref 19.0–186.0)
Retic Ct Pct: 0.6 % (ref 0.4–3.1)
Reticulocyte Hemoglobin: 26.4 pg — ABNORMAL LOW (ref >27.9)

## 2023-06-03 LAB — FERRITIN: Ferritin: 7 ng/mL — ABNORMAL LOW (ref 11–307)

## 2023-06-03 LAB — CMP (CANCER CENTER ONLY)
ALT: 8 U/L (ref 0–44)
AST: 13 U/L — ABNORMAL LOW (ref 15–41)
Albumin: 3.9 g/dL (ref 3.5–5.0)
Alkaline Phosphatase: 74 U/L (ref 38–126)
Anion gap: 7 (ref 5–15)
BUN: 13 mg/dL (ref 8–23)
CO2: 28 mmol/L (ref 22–32)
Calcium: 9.4 mg/dL (ref 8.9–10.3)
Chloride: 106 mmol/L (ref 98–111)
Creatinine: 0.88 mg/dL (ref 0.44–1.00)
GFR, Estimated: >60 mL/min (ref >60)
Glucose, Bld: 90 mg/dL (ref 70–99)
Potassium: 4.2 mmol/L (ref 3.5–5.1)
Sodium: 141 mmol/L (ref 135–145)
Total Bilirubin: 0.4 mg/dL (ref 0.0–1.2)
Total Protein: 7 g/dL (ref 6.5–8.1)

## 2023-06-03 LAB — CBC WITH DIFFERENTIAL (CANCER CENTER ONLY)
Abs Immature Granulocytes: 0.01 10*3/uL (ref 0.00–0.07)
Basophils Absolute: 0.1 10*3/uL (ref 0.0–0.1)
Basophils Relative: 1 %
Eosinophils Absolute: 0.1 10*3/uL (ref 0.0–0.5)
Eosinophils Relative: 2 %
HCT: 38.2 % (ref 36.0–46.0)
Hemoglobin: 12.1 g/dL (ref 12.0–15.0)
Immature Granulocytes: 0 %
Lymphocytes Relative: 18 %
Lymphs Abs: 1.1 10*3/uL (ref 0.7–4.0)
MCH: 24.1 pg — ABNORMAL LOW (ref 26.0–34.0)
MCHC: 31.7 g/dL (ref 30.0–36.0)
MCV: 76.1 fL — ABNORMAL LOW (ref 80.0–100.0)
Monocytes Absolute: 0.6 10*3/uL (ref 0.1–1.0)
Monocytes Relative: 10 %
Neutro Abs: 4.5 10*3/uL (ref 1.7–7.7)
Neutrophils Relative %: 69 %
Platelet Count: 268 10*3/uL (ref 150–400)
RBC: 5.02 MIL/uL (ref 3.87–5.11)
RDW: 14.9 % (ref 11.5–15.5)
WBC Count: 6.4 10*3/uL (ref 4.0–10.5)
nRBC: 0 % (ref 0.0–0.2)

## 2023-06-03 LAB — IRON AND IRON BINDING CAPACITY (CC-WL,HP ONLY)
Iron: 31 ug/dL (ref 28–170)
Saturation Ratios: 8 % — ABNORMAL LOW (ref 10.4–31.8)
TIBC: 409 ug/dL (ref 250–450)
UIBC: 378 ug/dL (ref 148–442)

## 2023-06-03 LAB — FOLATE: Folate: 7.1 ng/mL (ref >5.9)

## 2023-06-03 LAB — VITAMIN B12: Vitamin B-12: 176 pg/mL — ABNORMAL LOW (ref 180–914)
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
## 2023-06-04 ENCOUNTER — Telehealth: Payer: Self-pay | Admitting: Nurse Practitioner

## 2023-06-04 ENCOUNTER — Other Ambulatory Visit (HOSPITAL_COMMUNITY): Payer: Self-pay

## 2023-06-04 NOTE — Telephone Encounter (Signed)
Copied from CRM [id number]. Topic: Clinical - Prescription Issue >> Jun 04, 2023  4:10 PM Denese Killings wrote: Reason for CRM: Patient stated that [facility] has not received the Prior Authorization yet regarding pt Semaglutide-Weight Management [facility]).  Patient stated that they can fill it today if it is sent over. Pt has been without medication for 2 weeks.

## 2023-06-04 NOTE — Telephone Encounter (Signed)
PA for [facility] 1.7mg /0.75 Pen Injctr Request: [id number] Approved 02/16/2023-02/16/2024

## 2023-06-06 LAB — METHYLMALONIC ACID, SERUM: Methylmalonic Acid, Quantitative: 299 nmol/L (ref 0–378)

## 2023-06-07 ENCOUNTER — Other Ambulatory Visit (HOSPITAL_COMMUNITY): Payer: Self-pay

## 2023-06-07 NOTE — Telephone Encounter (Signed)
Contacted pt's Pharmacy.  Pharmacy staff stated that [facility] prescription still needs a PA.  On 1/17 there is an encounter stating approval for medication but the pharmacist states that the price exceeds maximum and requires a PA.

## 2023-06-07 NOTE — Telephone Encounter (Signed)
PA was submitted 06/01/2023, and approved 06/04/2023. Signing off on encounter.

## 2023-06-08 ENCOUNTER — Other Ambulatory Visit (HOSPITAL_COMMUNITY): Payer: Self-pay

## 2023-06-08 NOTE — Telephone Encounter (Signed)
Pharmacy Patient Advocate Encounter  Prior authorization is active. Filling pharmacy needs to contact insurance [id number]) for override on cost limit exceeded.

## 2023-06-08 NOTE — Telephone Encounter (Signed)
Contacted pt's pharmacy.  Pharmacy staff stated that they are not sure what the PA will do if the cost already exceeds maximum. Staff also stated that they will contact pt's insurance regarding the override and then give our office a call back with an update.

## 2023-06-15 ENCOUNTER — Other Ambulatory Visit: Payer: Self-pay | Admitting: Nurse Practitioner

## 2023-06-15 DIAGNOSIS — F419 Anxiety disorder, unspecified: Secondary | ICD-10-CM

## 2023-06-17 ENCOUNTER — Other Ambulatory Visit: Payer: Self-pay | Admitting: Nurse Practitioner

## 2023-06-17 ENCOUNTER — Encounter: Payer: Self-pay | Admitting: Nurse Practitioner

## 2023-06-17 DIAGNOSIS — F419 Anxiety disorder, unspecified: Secondary | ICD-10-CM

## 2023-06-17 MED ORDER — SERTRALINE HCL 50 MG PO TABS: 50.0000 mg | ORAL_TABLET | Freq: Every day | ORAL | 1 refills | Status: DC

## 2023-06-17 MED ORDER — BUSPIRONE HCL 5 MG PO TABS: 5.0000 mg | ORAL_TABLET | Freq: Two times a day (BID) | ORAL | 5 refills | Status: DC

## 2023-06-18 NOTE — Telephone Encounter (Signed)
It is up the pt to decide if she wants to pay the current price of the medication. A PA was done and approved. Tier Exemption is not an option.

## 2023-06-25 ENCOUNTER — Other Ambulatory Visit (HOSPITAL_COMMUNITY): Payer: Self-pay

## 2023-06-29 NOTE — Telephone Encounter (Signed)
  Pt requests for refill and Prior Authorization made for [facility].  LAST APPOINTMENT DATE: 04/23/2023  NEXT APPOINTMENT DATE: N/A  LAST PA: 06/08/23 QTY: 3mL

## 2023-06-30 MED ORDER — WEGOVY 2.4 MG/0.75ML ~~LOC~~ SOAJ
2.4000 mg | SUBCUTANEOUS | 0 refills | Status: DC
  Filled 2023-06-30 – 2023-07-12 (×3): qty 3, 28d supply, fill #0

## 2023-06-30 NOTE — Addendum Note (Signed)
Addended by: Eden Emms on: 06/30/2023 07:21 PM   Modules accepted: Orders

## 2023-07-01 ENCOUNTER — Other Ambulatory Visit (HOSPITAL_COMMUNITY): Payer: Self-pay

## 2023-07-06 ENCOUNTER — Other Ambulatory Visit (HOSPITAL_COMMUNITY): Payer: Self-pay

## 2023-07-08 ENCOUNTER — Other Ambulatory Visit: Payer: Self-pay | Admitting: Hematology and Oncology

## 2023-07-09 ENCOUNTER — Telehealth: Payer: Self-pay | Admitting: *Deleted

## 2023-07-09 ENCOUNTER — Telehealth: Payer: Self-pay | Admitting: Pharmacy Technician

## 2023-07-09 ENCOUNTER — Encounter: Payer: Self-pay | Admitting: Hematology and Oncology

## 2023-07-09 ENCOUNTER — Other Ambulatory Visit (HOSPITAL_COMMUNITY): Payer: Self-pay

## 2023-07-09 NOTE — Telephone Encounter (Addendum)
Dr. Leonides Schanz, [facility] note:  patient will be scheduled as soon as possible.  Auth Submission: NO AUTH NEEDED Site of care: Site of care: [facility] Payer: [employer] Medication & CPT/J Code(s) submitted: Feraheme (ferumoxytol) [id number] Route of submission (phone, fax, portal):  Phone # Fax # Auth type: Buy/Bill PB Units/visits requested: 2 DOSES Reference number: [id number] Tina-S 9:43a Tammy- Approval from: 07/09/23 to 12/06/23

## 2023-07-09 NOTE — Telephone Encounter (Signed)
TCT patient. Spoke with her Advised  that her current labs show that she is persistently iron deficient.  We will set her up for IV iron infusions. Advised that she is also vitamin B12 deficient. Asked if she is currently receiving B12 injections.  She has not received these and would like to get these here at our clinic. Advised that once she finishes her IV iron schedule, we will see her back in 4-6 weeks to repeat labs and have clinic visit. She states she will call once her IV iron appts are set.

## 2023-07-09 NOTE — Telephone Encounter (Signed)
Lori Summers T   07/09/2023 10:32 AM  Patient has called in on behalf of needing a prior authorization for her Semaglutide-Weight Management (WEGOVY) 2.4 MG/0.75ML SOAJ . Patient is already a week out without her medication and she's urgently requesting a call back with an update. Patient call back number is [phone]

## 2023-07-09 NOTE — Telephone Encounter (Signed)
Left detailed message on VM per DPR that a PA was active for the medication but that the insurance did not allow a Tier Exemption for the [facility] to make it cost less.

## 2023-07-12 ENCOUNTER — Other Ambulatory Visit (HOSPITAL_COMMUNITY): Payer: Self-pay

## 2023-07-12 ENCOUNTER — Telehealth: Payer: Self-pay | Admitting: Pharmacy Technician

## 2023-07-12 NOTE — Telephone Encounter (Signed)
 Pharmacy Patient Advocate Encounter   Received notification from CoverMyMeds that prior authorization for [facility] 2.4MG /0.75ML PEN is required/requested.   Insurance verification completed.   The patient is insured through [employer] .   Per test claim: PA required; PA submitted to above mentioned insurance via CoverMyMeds Key/confirmation #/EOC [id number] Status is pending

## 2023-07-13 ENCOUNTER — Other Ambulatory Visit (HOSPITAL_COMMUNITY): Payer: Self-pay

## 2023-07-13 ENCOUNTER — Other Ambulatory Visit: Payer: Self-pay | Admitting: Hematology and Oncology

## 2023-07-13 NOTE — Telephone Encounter (Signed)
 Pharmacy Patient Advocate Encounter  Ran test claim for [facility] 2.4mg /0.18ml Pen. Refill too soon. Medication last filled on 07/12/2023 and next refill due 08/02/2023. No further PA needed at this time.

## 2023-07-20 ENCOUNTER — Telehealth: Payer: Self-pay

## 2023-07-20 ENCOUNTER — Ambulatory Visit: Payer: No Typology Code available for payment source

## 2023-07-20 NOTE — Telephone Encounter (Signed)
 Patient was scheduled for b12 injection on 07/20/23. Nothing in chart regarding Matt approving patient to have B12 injections done at our clinic, canceled appt. Spoke with patient she states her hematologist recommended b12 injections due to her blood work being low, patient is requesting Susy Frizzle advise on if she needs b12 injections and how many.

## 2023-07-21 NOTE — Telephone Encounter (Signed)
 B12 injections are appropriate. Has she tried over the counter B12 first? She will need it 2 times a month for 2 months and then monthly there after

## 2023-07-21 NOTE — Telephone Encounter (Signed)
 Called and spoke with patient, she has not tried any oral b12, she preferred to do injections. Scheduled patient for b12 injections.

## 2023-07-30 ENCOUNTER — Ambulatory Visit: Payer: No Typology Code available for payment source

## 2023-08-03 ENCOUNTER — Ambulatory Visit

## 2023-08-09 ENCOUNTER — Other Ambulatory Visit: Payer: Self-pay | Admitting: Nurse Practitioner

## 2023-08-09 DIAGNOSIS — G4709 Other insomnia: Secondary | ICD-10-CM

## 2023-08-09 NOTE — Telephone Encounter (Signed)
 Copied from CRM [id number]. Topic: Clinical - Medication Refill >> Aug 09, 2023  4:47 PM Efraim Kaufmann C wrote: Most Recent Primary Care Visit:  Provider: Eden Emms  Department: [facility]  Visit Type: OFFICE VISIT  Date: 04/23/2023  Medication: Semaglutide-Weight Management (WEGOVY) 2.4 MG/0.75ML SOAJ  Has the patient contacted their pharmacy? No (Agent: If no, request that the patient contact the pharmacy for the refill. If patient does not wish to contact the pharmacy document the reason why and proceed with request.) (Agent: If yes, when and what did the pharmacy advise?)  Is this the correct pharmacy for this prescription? Yes If no, delete pharmacy and type the correct one.  This is the patient's preferred pharmacy:    [facility] [address] [place] [address] Phone: [phone] Fax: [phone]   Has the prescription been filled recently? No  Is the patient out of the medication? Yes  Has the patient been seen for an appointment in the last year OR does the patient have an upcoming appointment? Yes  Can we respond through MyChart? Yes  Agent: Please be advised that Rx refills may take up to 3 business days. We ask that you follow-up with your pharmacy.

## 2023-08-10 ENCOUNTER — Encounter (HOSPITAL_COMMUNITY): Payer: Self-pay

## 2023-08-10 ENCOUNTER — Encounter: Payer: Self-pay | Admitting: Nurse Practitioner

## 2023-08-10 ENCOUNTER — Other Ambulatory Visit: Payer: Self-pay | Admitting: Nurse Practitioner

## 2023-08-10 ENCOUNTER — Other Ambulatory Visit (HOSPITAL_COMMUNITY): Payer: Self-pay

## 2023-08-11 ENCOUNTER — Other Ambulatory Visit (HOSPITAL_COMMUNITY): Payer: Self-pay

## 2023-08-11 ENCOUNTER — Encounter (HOSPITAL_COMMUNITY): Payer: Self-pay

## 2023-08-11 MED ORDER — WEGOVY 2.4 MG/0.75ML ~~LOC~~ SOAJ
2.4000 mg | SUBCUTANEOUS | 0 refills | Status: DC
  Filled 2023-08-11: qty 3, 28d supply, fill #0

## 2023-08-13 ENCOUNTER — Ambulatory Visit: Payer: No Typology Code available for payment source

## 2023-08-17 ENCOUNTER — Ambulatory Visit

## 2023-08-25 ENCOUNTER — Other Ambulatory Visit: Payer: Self-pay | Admitting: Hematology and Oncology

## 2023-08-25 ENCOUNTER — Inpatient Hospital Stay: Payer: No Typology Code available for payment source | Attending: Hematology and Oncology

## 2023-08-25 DIAGNOSIS — D500 Iron deficiency anemia secondary to blood loss (chronic): Secondary | ICD-10-CM

## 2023-08-31 ENCOUNTER — Ambulatory Visit

## 2023-09-01 ENCOUNTER — Ambulatory Visit: Payer: No Typology Code available for payment source | Admitting: Physician Assistant

## 2023-09-07 ENCOUNTER — Other Ambulatory Visit: Payer: Self-pay | Admitting: Nurse Practitioner

## 2023-09-07 ENCOUNTER — Other Ambulatory Visit (HOSPITAL_COMMUNITY): Payer: Self-pay

## 2023-09-07 NOTE — Telephone Encounter (Signed)
 Copied from CRM [id number]. Topic: Clinical - Medication Refill >> Sep 07, 2023  2:50 PM Adonis Hoot wrote: Most Recent Primary Care Visit:  Provider: Dorothe Gaster  Department: [facility]  Visit Type: OFFICE VISIT  Date: 04/23/2023  Medication: Semaglutide -Weight Management (WEGOVY ) 2.4 MG/0.75ML SOAJ  Has the patient contacted their pharmacy? Yes (Agent: If no, request that the patient contact the pharmacy for the refill. If patient does not wish to contact the pharmacy document the reason why and proceed with request.) (Agent: If yes, when and what did the pharmacy advise?)  Is this the correct pharmacy for this prescription? Yes If no, delete pharmacy and type the correct one.  This is the patient's preferred pharmacy:    [facility] [address] [place] [address] Phone: [phone] Fax: [phone]   Has the prescription been filled recently? Yes  Is the patient out of the medication? Yes  Has the patient been seen for an appointment in the last year OR does the patient have an upcoming appointment? Yes  Can we respond through MyChart? Yes  Agent: Please be advised that Rx refills may take up to 3 business days. We ask that you follow-up with your pharmacy.

## 2023-09-08 ENCOUNTER — Other Ambulatory Visit (HOSPITAL_COMMUNITY): Payer: Self-pay

## 2023-09-08 ENCOUNTER — Encounter (HOSPITAL_COMMUNITY): Payer: Self-pay

## 2023-09-08 MED ORDER — WEGOVY 2.4 MG/0.75ML ~~LOC~~ SOAJ
2.4000 mg | SUBCUTANEOUS | 0 refills | Status: DC
  Filled 2023-09-08 (×2): qty 3, 28d supply, fill #0

## 2023-09-08 MED ORDER — WEGOVY 2.4 MG/0.75ML ~~LOC~~ SOAJ
2.4000 mg | SUBCUTANEOUS | 0 refills | Status: DC
  Filled 2023-09-08: qty 9, 84d supply, fill #0

## 2023-09-14 ENCOUNTER — Ambulatory Visit

## 2023-10-04 ENCOUNTER — Other Ambulatory Visit (HOSPITAL_COMMUNITY): Payer: Self-pay

## 2023-10-04 ENCOUNTER — Other Ambulatory Visit: Payer: Self-pay | Admitting: Nurse Practitioner

## 2023-10-04 MED ORDER — WEGOVY 2.4 MG/0.75ML ~~LOC~~ SOAJ
2.4000 mg | SUBCUTANEOUS | 0 refills | Status: DC
Start: 2023-10-04 — End: 2023-10-14
  Filled 2023-10-04: qty 3, 28d supply, fill #0

## 2023-10-04 NOTE — Telephone Encounter (Unsigned)
 Copied from CRM [id number]. Topic: Clinical - Medication Refill >> Oct 04, 2023 11:41 AM Adonis Hoot wrote: Medication: Semaglutide -Weight Management (WEGOVY ) 2.4 MG/0.75ML SOAJ  Has the patient contacted their pharmacy? Yes (Agent: If no, request that the patient contact the pharmacy for the refill. If patient does not wish to contact the pharmacy document the reason why and proceed with request.) (Agent: If yes, when and what did the pharmacy advise?)  This is the patient's preferred pharmacy:    [place] [facility] [address] [place] [address] Phone: [phone] Fax: [phone]  Is this the correct pharmacy for this prescription? Yes If no, delete pharmacy and type the correct one.   Has the prescription been filled recently? No  Is the patient out of the medication? Yes  Has the patient been seen for an appointment in the last year OR does the patient have an upcoming appointment? Yes  Can we respond through MyChart? Yes  Agent: Please be advised that Rx refills may take up to 3 business days. We ask that you follow-up with your pharmacy.

## 2023-10-14 ENCOUNTER — Other Ambulatory Visit (HOSPITAL_COMMUNITY): Payer: Self-pay

## 2023-10-14 ENCOUNTER — Ambulatory Visit: Admitting: Nurse Practitioner

## 2023-10-14 VITALS — BP 118/76 | HR 70 | Temp 98.4°F | Ht 64.0 in | Wt 137.2 lb

## 2023-10-14 DIAGNOSIS — E663 Overweight: Secondary | ICD-10-CM | POA: Diagnosis not present

## 2023-10-14 DIAGNOSIS — G4709 Other insomnia: Secondary | ICD-10-CM | POA: Diagnosis not present

## 2023-10-14 MED ORDER — WEGOVY 2.4 MG/0.75ML ~~LOC~~ SOAJ
2.4000 mg | SUBCUTANEOUS | 0 refills | Status: DC
  Filled 2023-10-14: qty 9, 84d supply, fill #0

## 2023-10-14 MED ORDER — METHOCARBAMOL 500 MG PO TABS: 500.0000 mg | ORAL_TABLET | Freq: Two times a day (BID) | ORAL | 0 refills | Status: AC | PRN

## 2023-10-14 NOTE — Assessment & Plan Note (Signed)
 Patient currently maintained on trazodone  100 mg at nighttime.  She is waking up Within the night suggested patient take 50 mg nightly and if she wakes up in a reasonable Mela time take the other half of the tablet totaling 100 mg nightly.  She will reach out to me via MyChart if this is ineffective

## 2023-10-14 NOTE — Progress Notes (Signed)
 Established Patient Office Visit  Subjective   Patient ID: Lori Summers, female    DOB: 10-01-1960  Age: 63 y.o. MRN: [id number]  Chief Complaint  Patient presents with   Medication Management    Pt complains of med check for Trazodone . States she is going in and out of sleep. Pt complains of medication not helping at bedtime. Pt would like to discuss concerns regarding Wegovy .   Medication Refill    Robaxin      HPI  Obesity: Patient currently maintained on semaglutide  2.4 mg weekly.  Patient was started on medication on 02/12/2023 with the beginning weight of 558 pounds.  Patient does have a history of prediabetes and gastric resection.  Patient was recently called me on 04/23/2019 for with a weight of 153.  She is here today for follow-up She is doing well on the medication. Having a BM tiwce a week.  She is eating 1 meal a day and a snack. She is drinking water mostly and sometimes a sweet tea Exercise: she is walking 2-3 times a week.   Insomnia: states that she has been on trazodone  at night. She will take the medication and go to sleep and then will wake up and will be up. States that she normally gets up around 6am. She will go to bed around 10   Review of Systems  Constitutional:  Negative for chills and fever.  Respiratory:  Negative for shortness of breath.   Cardiovascular:  Negative for chest pain.  Gastrointestinal:  Negative for abdominal pain, constipation, diarrhea, nausea and vomiting.  Neurological:  Negative for dizziness and headaches.      Objective:     BP 118/76   Pulse 70   Temp 98.4 F (36.9 C) (Oral)   Ht 5\' 4"  (1.626 m)   Wt 137 lb 3.2 oz (62.2 kg)   LMP 01/07/2013   SpO2 99%   BMI 23.55 kg/m  BP Readings from Last 3 Encounters:  10/14/23 118/76  06/03/23 135/82  04/23/23 112/84   Wt Readings from Last 3 Encounters:  10/14/23 137 lb 3.2 oz (62.2 kg)  06/03/23 154 lb 8 oz (70.1 kg)  04/23/23 153 lb 9.6 oz (69.7 kg)   SpO2 Readings from  Last 3 Encounters:  10/14/23 99%  06/03/23 100%  04/23/23 96%      Physical Exam Vitals and nursing note reviewed.  Constitutional:      Appearance: Normal appearance.  Cardiovascular:     Rate and Rhythm: Normal rate and regular rhythm.     Heart sounds: Normal heart sounds.  Pulmonary:     Effort: Pulmonary effort is normal.     Breath sounds: Normal breath sounds.  Abdominal:     General: Bowel sounds are normal.  Neurological:     Mental Status: She is alert.      No results found for any visits on 10/14/23.    The 10-year ASCVD risk score (Arnett DK, et al., 2019) is: 4.7%    Assessment & Plan:   Problem List Items Addressed This Visit       Other   Overweight (BMI 25.0-29.9) - Primary   History of the same but currently in remission with Wegovy .  Currently maintained on Wegovy  2.4 mg weekly.  Patient is within normal limits for her weight.  She does have comorbidities of prediabetes and HLD.  Continue taking medication as prescribed.  Continue work on healthy lifestyle modifications      Other insomnia   Patient  currently maintained on trazodone  100 mg at nighttime.  She is waking up Within the night suggested patient take 50 mg nightly and if she wakes up in a reasonable Mela time take the other half of the tablet totaling 100 mg nightly.  She will reach out to me via MyChart if this is ineffective       Return in about 3 months (around 01/14/2024) for CPE and Labs.    Margarie Shay, NP

## 2023-10-14 NOTE — Patient Instructions (Signed)
 Nice to see you today Try taking half a tablet of the trazodone  at bed and then take the other half if you wake up in the night Follow up with me in 3 months, sooner if you need me

## 2023-10-14 NOTE — Assessment & Plan Note (Signed)
 History of the same but currently in remission with Wegovy .  Currently maintained on Wegovy  2.4 mg weekly.  Patient is within normal limits for her weight.  She does have comorbidities of prediabetes and HLD.  Continue taking medication as prescribed.  Continue work on healthy lifestyle modifications

## 2023-10-29 ENCOUNTER — Telehealth: Payer: Self-pay

## 2023-10-29 NOTE — Telephone Encounter (Signed)
 Copied from CRM [id number]. Topic: Clinical - Medication Question >> Oct 29, 2023  2:50 PM Abigail D wrote: Reason for CRM: Patient has refill coming up next week, her insurance is changing in July and was told she could get it refilled for 3 months, sent to [facility]. Wants to verify her next refill will be 90 days.

## 2023-11-01 ENCOUNTER — Other Ambulatory Visit (HOSPITAL_COMMUNITY): Payer: Self-pay

## 2023-11-01 MED ORDER — WEGOVY 2.4 MG/0.75ML ~~LOC~~ SOAJ
2.4000 mg | SUBCUTANEOUS | 0 refills | Status: AC
  Filled 2023-11-01: qty 9, 84d supply, fill #0

## 2023-11-01 NOTE — Addendum Note (Signed)
 Addended by: Dorothe Gaster on: 11/01/2023 01:02 PM   Modules accepted: Orders

## 2023-11-01 NOTE — Telephone Encounter (Signed)
90 day supply sent in 

## 2023-12-18 ENCOUNTER — Other Ambulatory Visit: Payer: Self-pay | Admitting: Nurse Practitioner

## 2023-12-18 DIAGNOSIS — F32A Depression, unspecified: Secondary | ICD-10-CM

## 2023-12-22 ENCOUNTER — Other Ambulatory Visit: Payer: Self-pay | Admitting: Nurse Practitioner

## 2023-12-22 DIAGNOSIS — F419 Anxiety disorder, unspecified: Secondary | ICD-10-CM

## 2024-01-21 ENCOUNTER — Ambulatory Visit: Admitting: Nurse Practitioner

## 2024-01-21 ENCOUNTER — Encounter: Payer: Self-pay | Admitting: Nurse Practitioner

## 2024-01-21 VITALS — BP 110/60 | HR 84 | Temp 98.2°F | Ht 63.0 in | Wt 132.6 lb

## 2024-01-21 DIAGNOSIS — E7800 Pure hypercholesterolemia, unspecified: Secondary | ICD-10-CM

## 2024-01-21 DIAGNOSIS — F32A Depression, unspecified: Secondary | ICD-10-CM

## 2024-01-21 DIAGNOSIS — Z9884 Bariatric surgery status: Secondary | ICD-10-CM

## 2024-01-21 DIAGNOSIS — Z1159 Encounter for screening for other viral diseases: Secondary | ICD-10-CM

## 2024-01-21 DIAGNOSIS — F419 Anxiety disorder, unspecified: Secondary | ICD-10-CM | POA: Diagnosis not present

## 2024-01-21 DIAGNOSIS — Z0000 Encounter for general adult medical examination without abnormal findings: Secondary | ICD-10-CM

## 2024-01-21 DIAGNOSIS — K219 Gastro-esophageal reflux disease without esophagitis: Secondary | ICD-10-CM

## 2024-01-21 DIAGNOSIS — R7303 Prediabetes: Secondary | ICD-10-CM

## 2024-01-21 DIAGNOSIS — Z1211 Encounter for screening for malignant neoplasm of colon: Secondary | ICD-10-CM

## 2024-01-21 DIAGNOSIS — D509 Iron deficiency anemia, unspecified: Secondary | ICD-10-CM

## 2024-01-21 NOTE — Assessment & Plan Note (Signed)
 History of the same.  Pending A1c today patient has lost weight and is now within the normal BMI

## 2024-01-21 NOTE — Assessment & Plan Note (Signed)
 Pending vitamin D  and B12 level.  Patient is followed by hematology for iron  deficiency

## 2024-01-21 NOTE — Assessment & Plan Note (Signed)
 Patient currently maintained on BuSpar  5 mg twice daily and sertraline  50 mg daily.  Denies HI/SI/AVH.  Continue BuSpar  5 mg twice daily and sertraline  50 mg daily.

## 2024-01-21 NOTE — Progress Notes (Signed)
 Established Patient Office Visit  Subjective   Patient ID: Lori Summers, female    DOB: 06-Mar-1961  Age: 63 y.o. MRN: [id number]  Chief Complaint  Patient presents with   Annual Exam    HPI  Anxiety: Patient currently maintained on BuSpar  5 mg twice daily along with sertraline  50 mg daily  Insomnia: Patient currently maintained on trazodone  100 mg nightly  Obesity: Patient currently maintained on semaglutide  2.4 mg weekly  IDA: Patient currently followed by hematology  for complete physical and follow up of chronic conditions.  Immunizations: -Tetanus: Completed in up to date -Influenza: Will get at her employer -Shingles: Refused, discussed in office -Pneumonia: Refused, discussed in office  Diet: Fair diet. She is eating 1-2 meals a day. She does not snack. She is drinking water and milk Exercise:  Walking 2 days a week and goes to the park   Eye exam: Needs updating. Wears  Dental exam: Completes semi-annually    Colonoscopy: Colooguard Lung Cancer Screening: N/A  Pap smear: 2023 Dr. Reginal   Mammogram: at GYN, up-to-date  DEXA: Followed by GYN  Sleep: goes to bed around 1030 and will get up around 6am. Feels rested       Review of Systems  Constitutional:  Negative for chills and fever.  Respiratory:  Negative for shortness of breath.   Cardiovascular:  Negative for chest pain and leg swelling.  Gastrointestinal:  Negative for abdominal pain, blood in stool, constipation, diarrhea, nausea and vomiting.       BM twice a week   Genitourinary:  Negative for dysuria and hematuria.  Neurological:  Negative for tingling and headaches.  Psychiatric/Behavioral:  Negative for hallucinations and suicidal ideas.       Objective:     BP 110/60   Pulse 84   Temp 98.2 F (36.8 C) (Oral)   Ht 5' 3 (1.6 m)   Wt 132 lb 9.6 oz (60.1 kg)   LMP 01/07/2013   SpO2 97%   BMI 23.49 kg/m  BP Readings from Last 3 Encounters:  01/21/24 110/60  10/14/23 118/76   06/03/23 135/82   Wt Readings from Last 3 Encounters:  01/21/24 132 lb 9.6 oz (60.1 kg)  10/14/23 137 lb 3.2 oz (62.2 kg)  06/03/23 154 lb 8 oz (70.1 kg)   SpO2 Readings from Last 3 Encounters:  01/21/24 97%  10/14/23 99%  06/03/23 100%      Physical Exam Vitals and nursing note reviewed.  Constitutional:      Appearance: Normal appearance.  HENT:     Right Ear: Tympanic membrane, ear canal and external ear normal.     Left Ear: Tympanic membrane, ear canal and external ear normal.     Mouth/Throat:     Mouth: Mucous membranes are moist.     Pharynx: Oropharynx is clear.  Eyes:     Extraocular Movements: Extraocular movements intact.     Pupils: Pupils are equal, round, and reactive to light.  Cardiovascular:     Rate and Rhythm: Normal rate and regular rhythm.     Pulses: Normal pulses.     Heart sounds: Normal heart sounds.  Pulmonary:     Effort: Pulmonary effort is normal.     Breath sounds: Normal breath sounds.  Abdominal:     General: Bowel sounds are normal. There is no distension.     Palpations: There is no mass.     Tenderness: There is no abdominal tenderness.     Hernia: No hernia  is present.  Musculoskeletal:     Right lower leg: No edema.     Left lower leg: No edema.  Lymphadenopathy:     Cervical: No cervical adenopathy.  Skin:    General: Skin is warm.  Neurological:     General: No focal deficit present.     Mental Status: She is alert.     Deep Tendon Reflexes:     Reflex Scores:      Bicep reflexes are 2+ on the right side and 2+ on the left side.      Patellar reflexes are 2+ on the right side and 2+ on the left side.    Comments: Bilateral upper and lower extremity strength 5/5  Psychiatric:        Mood and Affect: Mood normal.        Behavior: Behavior normal.        Thought Content: Thought content normal.        Judgment: Judgment normal.      No results found for any visits on 01/21/24.    The 10-year ASCVD risk score  (Arnett DK, et al., 2019) is: 4.1%    Assessment & Plan:   Problem List Items Addressed This Visit       Digestive   GERD (gastroesophageal reflux disease)     Other   S/P gastric bypass   Pending vitamin D  and B12 level.  Patient is followed by hematology for iron  deficiency      Relevant Orders   CBC with Differential/Platelet   Comprehensive metabolic panel with GFR   Vitamin B12   VITAMIN D  25 Hydroxy (Vit-D Deficiency, Fractures)   Anxiety and depression   Patient currently maintained on BuSpar  5 mg twice daily and sertraline  50 mg daily.  Denies HI/SI/AVH.  Continue BuSpar  5 mg twice daily and sertraline  50 mg daily.      Iron  deficiency anemia   History of the same followed by hematology      Prediabetes   History of the same.  Pending A1c today patient has lost weight and is now within the normal BMI      Relevant Orders   Hemoglobin A1c   Elevated LDL cholesterol level   History of the same patient has recently lost weight.  Pending lipid panel today      Relevant Orders   Lipid panel   Preventative health care - Primary   Discussed age-appropriate immunizations and screening exams.  Did review patient's personal, surgical, social, family histories.  Patient is up-to-date on all age-appropriate vaccinations she would like.  She will get her flu vaccine through her employer.  Did discuss shingles and pneumonia vaccine patient would like to defer currently.  Cologuard ordered for CRC screening.  Patient up-to-date on breast cancer screening and cervical cancer screening.  She is followed by GYN.  Patient was given information at discharge about preventative healthcare maintenance with anticipatory guidance      Relevant Orders   CBC with Differential/Platelet   Comprehensive metabolic panel with GFR   TSH   Other Visit Diagnoses       Encounter for hepatitis C screening test for low risk patient       Relevant Orders   Hepatitis C antibody     Screening  for colon cancer       Relevant Orders   Cologuard       Return in about 1 year (around 01/20/2025) for CPE and Labs.    Matt  Wendee, NP

## 2024-01-21 NOTE — Assessment & Plan Note (Signed)
 History of the same patient has recently lost weight.  Pending lipid panel today

## 2024-01-21 NOTE — Patient Instructions (Signed)
 Nice to see you today I will be in touch with the labs once I have them  Follow up with me in 1 year.  Consider getting the shingles vaccine

## 2024-01-21 NOTE — Assessment & Plan Note (Signed)
 Discussed age-appropriate immunizations and screening exams.  Did review patient's personal, surgical, social, family histories.  Patient is up-to-date on all age-appropriate vaccinations she would like.  She will get her flu vaccine through her employer.  Did discuss shingles and pneumonia vaccine patient would like to defer currently.  Cologuard ordered for CRC screening.  Patient up-to-date on breast cancer screening and cervical cancer screening.  She is followed by GYN.  Patient was given information at discharge about preventative healthcare maintenance with anticipatory guidance

## 2024-01-21 NOTE — Assessment & Plan Note (Signed)
 History of the same followed by hematology

## 2024-01-22 LAB — CBC WITH DIFFERENTIAL/PLATELET
Absolute Lymphocytes: 1429 {cells}/uL (ref 850–3900)
Absolute Monocytes: 646 {cells}/uL (ref 200–950)
Basophils Absolute: 68 {cells}/uL (ref 0–200)
Basophils Relative: 0.9 %
Eosinophils Absolute: 91 {cells}/uL (ref 15–500)
Eosinophils Relative: 1.2 %
HCT: 35.4 % (ref 35.0–45.0)
Hemoglobin: 11.6 g/dL — ABNORMAL LOW (ref 11.7–15.5)
MCH: 28.2 pg (ref 27.0–33.0)
MCHC: 32.8 g/dL (ref 32.0–36.0)
MCV: 86.1 fL (ref 80.0–100.0)
MPV: 9.8 fL (ref 7.5–12.5)
Monocytes Relative: 8.5 %
Neutro Abs: 5366 {cells}/uL (ref 1500–7800)
Neutrophils Relative %: 70.6 %
Platelets: 247 Thousand/uL (ref 140–400)
RBC: 4.11 Million/uL (ref 3.80–5.10)
RDW: 12.2 % (ref 11.0–15.0)
Total Lymphocyte: 18.8 %
WBC: 7.6 Thousand/uL (ref 3.8–10.8)

## 2024-01-22 LAB — COMPREHENSIVE METABOLIC PANEL WITH GFR
AG Ratio: 1.6 (calc) (ref 1.0–2.5)
ALT: 8 U/L (ref 6–29)
AST: 14 U/L (ref 10–35)
Albumin: 3.6 g/dL (ref 3.6–5.1)
Alkaline phosphatase (APISO): 56 U/L (ref 37–153)
BUN: 13 mg/dL (ref 7–25)
CO2: 28 mmol/L (ref 20–32)
Calcium: 9.1 mg/dL (ref 8.6–10.4)
Chloride: 103 mmol/L (ref 98–110)
Creat: 0.84 mg/dL (ref 0.50–1.05)
Globulin: 2.2 g/dL (ref 1.9–3.7)
Glucose, Bld: 96 mg/dL (ref 65–99)
Potassium: 3.7 mmol/L (ref 3.5–5.3)
Sodium: 138 mmol/L (ref 135–146)
Total Bilirubin: 0.4 mg/dL (ref 0.2–1.2)
Total Protein: 5.8 g/dL — ABNORMAL LOW (ref 6.1–8.1)
eGFR: 77 mL/min/1.73m2 (ref >=60)

## 2024-01-22 LAB — HEPATITIS C ANTIBODY: Hepatitis C Ab: NONREACTIVE

## 2024-01-22 LAB — LIPID PANEL
Cholesterol: 172 mg/dL (ref <200)
HDL: 60 mg/dL (ref >=50)
LDL Cholesterol (Calc): 96 mg/dL
Non-HDL Cholesterol (Calc): 112 mg/dL (ref <130)
Total CHOL/HDL Ratio: 2.9 (calc) (ref <5.0)
Triglycerides: 74 mg/dL (ref <150)

## 2024-01-22 LAB — VITAMIN D 25 HYDROXY (VIT D DEFICIENCY, FRACTURES): Vit D, 25-Hydroxy: 73 ng/mL (ref 30–100)

## 2024-01-22 LAB — HEMOGLOBIN A1C
Hgb A1c MFr Bld: 5.2 % (ref <5.7)
Mean Plasma Glucose: 103 mg/dL
eAG (mmol/L): 5.7 mmol/L

## 2024-01-22 LAB — VITAMIN B12: Vitamin B-12: 248 pg/mL (ref 200–1100)

## 2024-01-22 LAB — TSH: TSH: 0.66 m[IU]/L (ref 0.40–4.50)

## 2024-01-24 ENCOUNTER — Ambulatory Visit: Payer: Self-pay | Admitting: Nurse Practitioner

## 2024-02-05 ENCOUNTER — Other Ambulatory Visit: Payer: Self-pay | Admitting: Nurse Practitioner

## 2024-02-05 DIAGNOSIS — G4709 Other insomnia: Secondary | ICD-10-CM
